# Patient Record
Sex: Female | Born: 1980 | Race: Black or African American | Hispanic: No | Marital: Single | Smoking: Former smoker
Health system: Southern US, Community
[De-identification: ages and names within clinical notes are randomized; demographics above are authoritative.]

## PROBLEM LIST (undated history)

## (undated) DIAGNOSIS — E669 Obesity, unspecified: Secondary | ICD-10-CM

## (undated) DIAGNOSIS — R519 Headache, unspecified: Secondary | ICD-10-CM

## (undated) DIAGNOSIS — D259 Leiomyoma of uterus, unspecified: Secondary | ICD-10-CM

## (undated) DIAGNOSIS — R51 Headache: Secondary | ICD-10-CM

## (undated) DIAGNOSIS — R87629 Unspecified abnormal cytological findings in specimens from vagina: Secondary | ICD-10-CM

## (undated) DIAGNOSIS — Z8759 Personal history of other complications of pregnancy, childbirth and the puerperium: Secondary | ICD-10-CM

## (undated) HISTORY — DX: Obesity, unspecified: E66.9

## (undated) HISTORY — DX: Personal history of other complications of pregnancy, childbirth and the puerperium: Z87.59

## (undated) HISTORY — PX: GALLBLADDER SURGERY: SHX652

## (undated) HISTORY — DX: Headache: R51

## (undated) HISTORY — DX: Unspecified abnormal cytological findings in specimens from vagina: R87.629

## (undated) HISTORY — DX: Leiomyoma of uterus, unspecified: D25.9

## (undated) HISTORY — PX: HERNIA REPAIR: SHX51

## (undated) HISTORY — DX: Headache, unspecified: R51.9

---

## 2004-01-07 ENCOUNTER — Other Ambulatory Visit: Admission: RE | Admit: 2004-01-07 | Discharge: 2004-01-07 | Payer: Self-pay | Admitting: *Deleted

## 2008-07-06 ENCOUNTER — Ambulatory Visit: Payer: Self-pay | Admitting: Family Medicine

## 2008-12-03 ENCOUNTER — Emergency Department: Payer: Self-pay | Admitting: Emergency Medicine

## 2008-12-08 ENCOUNTER — Ambulatory Visit: Payer: Self-pay | Admitting: Surgery

## 2008-12-14 ENCOUNTER — Inpatient Hospital Stay: Payer: Self-pay | Admitting: Surgery

## 2008-12-24 ENCOUNTER — Ambulatory Visit (HOSPITAL_COMMUNITY): Admission: RE | Admit: 2008-12-24 | Discharge: 2008-12-24 | Payer: Self-pay | Admitting: Gastroenterology

## 2009-10-09 IMAGING — RF DG ERCP WO/W SPHINCTEROTOMY
4 series · 9 of 9 positions shown · non-contrast
Comparison: None

CLINICAL DATA: Choledocholithiasis.

ERCP with sphincterotomy
Fluoroscopy time:  3.8 minutes.
TECHNIQUE: Multiple spot images obtained with the fluoroscopic
device and submitted for interpretation post-procedure.  ERCP
performed by Dr. Nomasibulele without a radiologist in attendance; please
see their report for details.

[Series 1: cont. · 4 of 4 slices shown (1 of 4)]
[im 1/4]
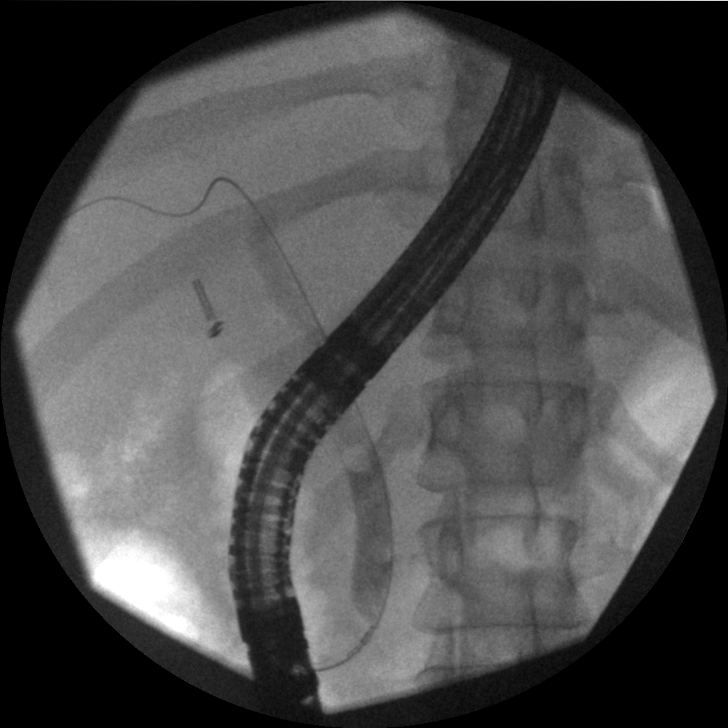
[im 2/4]
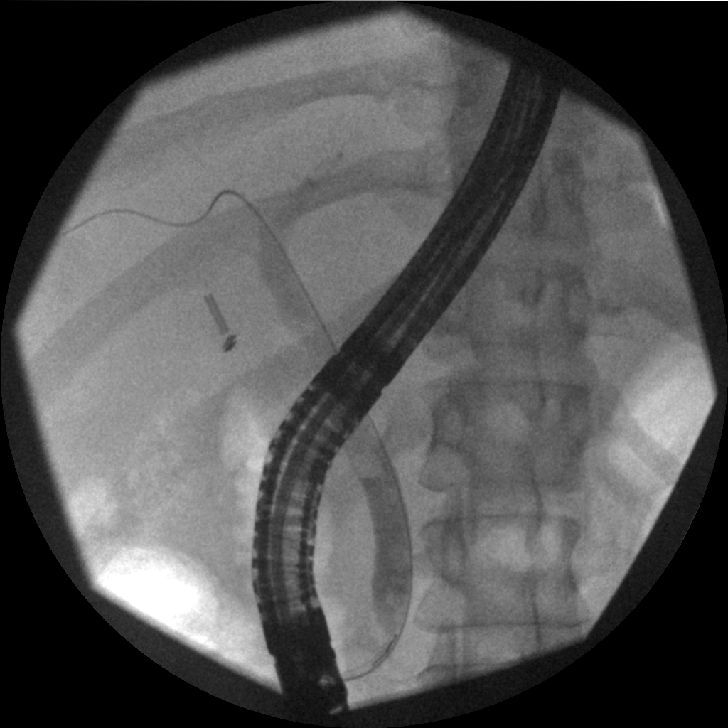
[im 3/4]
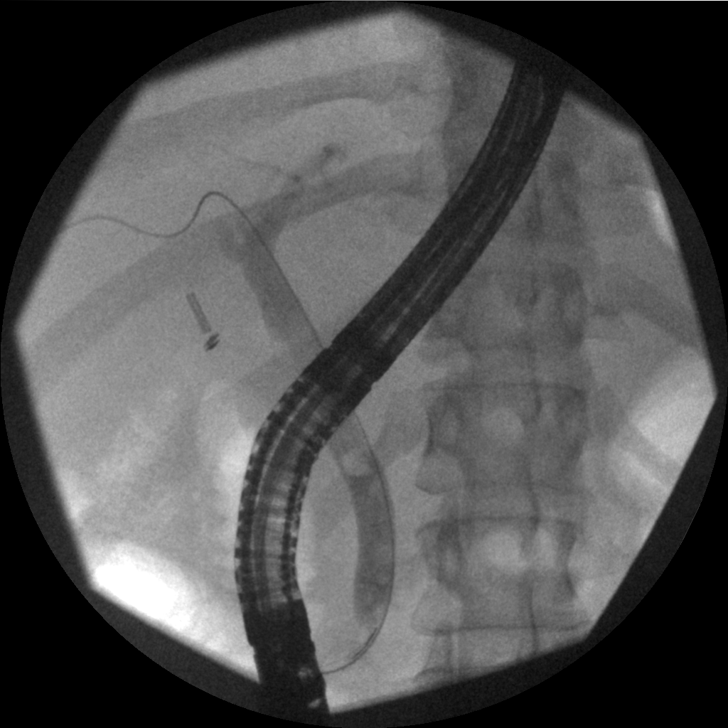
[im 4/4]
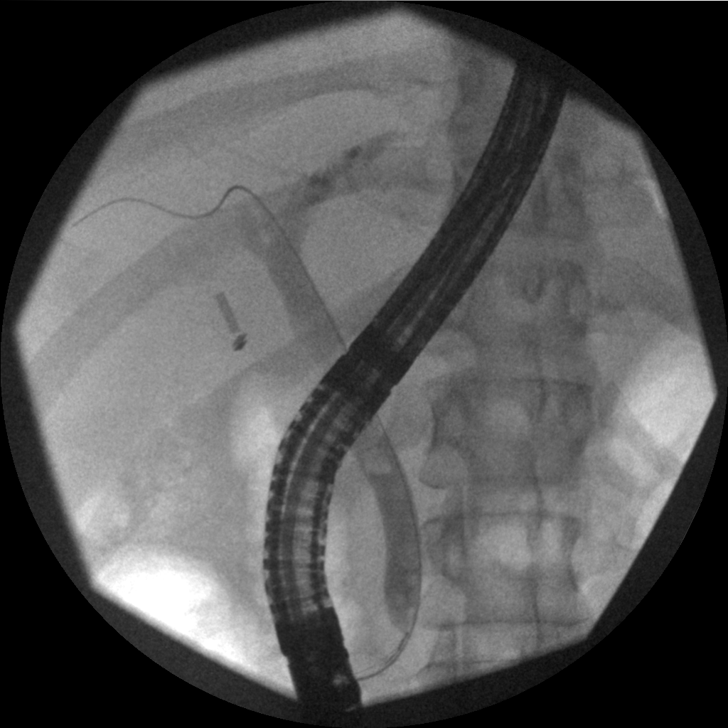

[Series 3: cont. · 3 of 3 slices shown (2 of 4)]
[im 1/3]
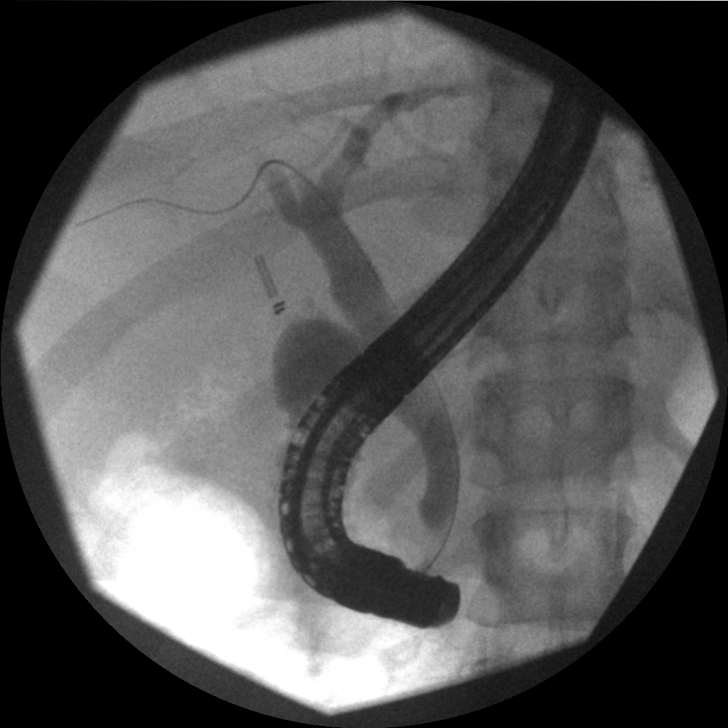
[im 2/3]
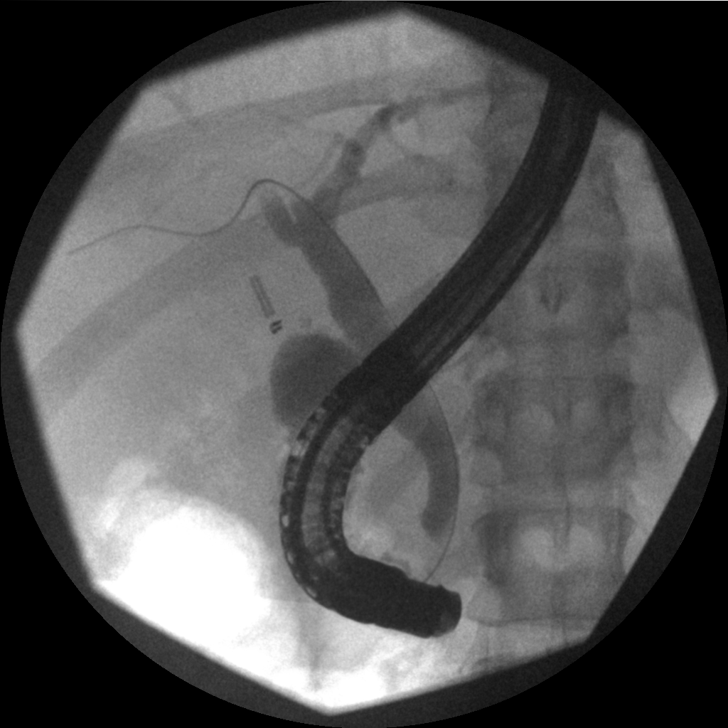
[im 3/3]
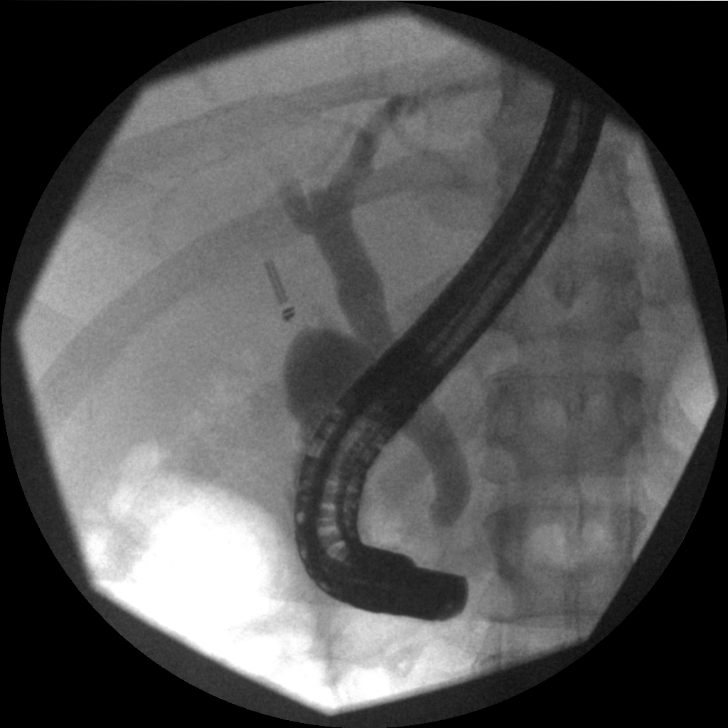

[Series 4: cont. · 1 of 1 slices shown (3 of 4)]
[im 1/1]
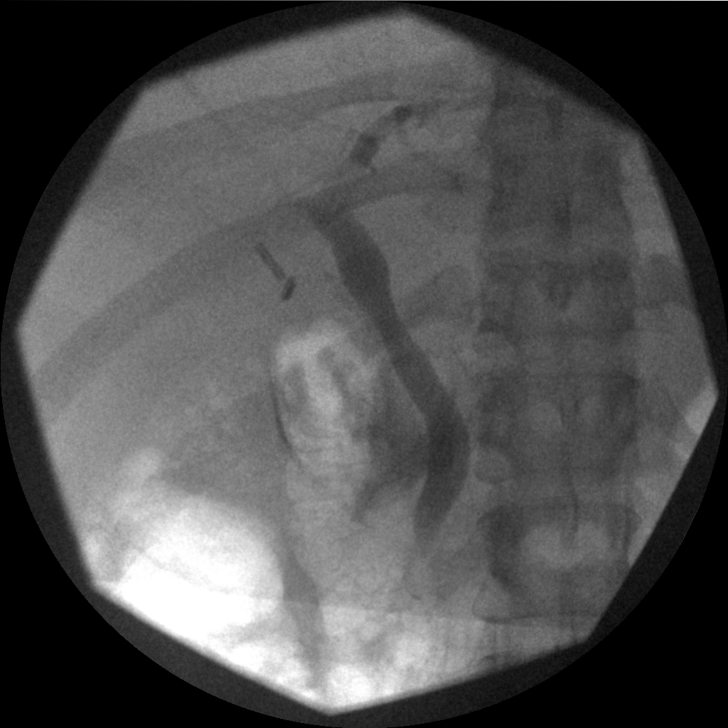

[Series 5: cont. · 1 of 1 slices shown (4 of 4)]
[im 1/1]
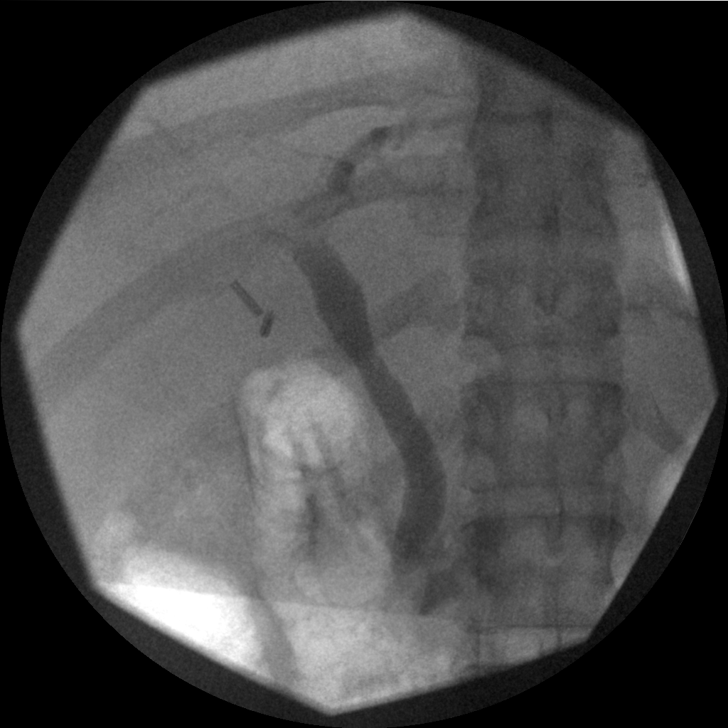

[9 of 9 positions shown; findings below may reference images not displayed]

FINDINGS: The initial submitted images demonstrate several small
filling defects in the common bile duct compatible with
choledocholithiasis.  Cholecystectomy clips are noted.  By report,
sphincterotomy and stone extraction were performed.  Final images
demonstrate no residual calculi within the common bile duct or
extravasation of contrast.  Good drainage into the duodenum is not
documented.
IMPRESSION: Choledocholithiasis with sphincterotomy and stone extraction as
described.

## 2014-08-27 NOTE — L&D Delivery Note (Signed)
Delivery Summary for [employer]  Labor Events:   Preterm labor:   Rupture date:   Rupture time:   Rupture type:   Fluid Color:   Induction:   Augmentation:   Complications:   Cervical ripening:          Delivery:   Episiotomy:   Lacerations:   Repair suture:   Repair # of packets:   Blood loss (ml): 300   Information for the patient's newborn:  Takyah, Ciaramitaro [[id number]]    Delivery 05/10/2015 2:50 AM by  Vaginal, Spontaneous Delivery Sex:  female Gestational Age: [redacted]w[redacted]d Delivery Clinician:  [place]?: Yes        APGARS  One minute Five minutes Ten minutes  Skin color: 0   1      Heart rate: 2   2      Grimace: 0   2      Muscle tone: 0   1      Breathing: 1   2      Totals: 3  8      Presentation/position: Vertex     Resuscitation:   Cord information: 3 vessels   Disposition of cord blood: No    Blood gases sent? Yes Complications: None  Placenta: Delivered: 05/10/2015 3:30 AM  Manual removal  Intact appearance Newborn Measurements: Weight: 6 lb 7 oz (2920 g)  Height: 20.87"  Head circumference: 29 cm  Chest circumference: 29 cm  Other providers: Registered Nurse Registered Nurse Darleen Crocker  Additional  information: Forceps:   Vacuum:   Breech:   Observed anomalies         Delivery Note At 2:50 AM a viable female was delivered via Vaginal, Spontaneous Delivery (Presentation:cephalic; LOA position ).  APGAR: 3,8 ; weight 2920 grams.   Placenta status: manually removed, intact.  Cord: 3 vessels with the following complications: None.  Cord pH: pending (venous)  Anesthesia: Epidural  Episiotomy: None Lacerations: 1st degree Suture Repair: 2.0  Mom to postpartum. To remain on Magnesium Sulfate for an additional 12-24 hours. Baby to Couplet care / Skin to Skin.  Rubie Maid 05/10/2015, 3:50 AM

## 2014-10-08 LAB — OB RESULTS CONSOLE PLATELET COUNT: Platelets: 350 10*3/uL

## 2014-10-08 LAB — OB RESULTS CONSOLE ANTIBODY SCREEN: Antibody Screen: NEGATIVE

## 2014-10-08 LAB — OB RESULTS CONSOLE ABO/RH: RH Type: POSITIVE

## 2014-10-08 LAB — OB RESULTS CONSOLE HIV ANTIBODY (ROUTINE TESTING): HIV: NONREACTIVE

## 2014-10-08 LAB — OB RESULTS CONSOLE VARICELLA ZOSTER ANTIBODY, IGG: Varicella: IMMUNE

## 2014-10-08 LAB — OB RESULTS CONSOLE HGB/HCT, BLOOD
HCT: 40 %
Hemoglobin: 13.7 g/dL

## 2014-10-08 LAB — OB RESULTS CONSOLE RUBELLA ANTIBODY, IGM: Rubella: IMMUNE

## 2014-10-08 LAB — OB RESULTS CONSOLE GC/CHLAMYDIA
Chlamydia: NEGATIVE
Gonorrhea: NEGATIVE

## 2014-10-08 LAB — OB RESULTS CONSOLE RPR: RPR: NONREACTIVE

## 2014-10-08 LAB — OB RESULTS CONSOLE HEPATITIS B SURFACE ANTIGEN: Hepatitis B Surface Ag: NEGATIVE

## 2015-01-27 ENCOUNTER — Ambulatory Visit (INDEPENDENT_AMBULATORY_CARE_PROVIDER_SITE_OTHER): Payer: BLUE CROSS/BLUE SHIELD | Admitting: Obstetrics and Gynecology

## 2015-01-27 ENCOUNTER — Encounter: Payer: Self-pay | Admitting: Obstetrics and Gynecology

## 2015-01-27 VITALS — BP 108/71 | HR 88 | Ht 60.0 in | Wt 204.5 lb

## 2015-01-27 DIAGNOSIS — O3412 Maternal care for benign tumor of corpus uteri, second trimester: Secondary | ICD-10-CM

## 2015-01-27 DIAGNOSIS — O99210 Obesity complicating pregnancy, unspecified trimester: Secondary | ICD-10-CM

## 2015-01-27 DIAGNOSIS — D259 Leiomyoma of uterus, unspecified: Secondary | ICD-10-CM

## 2015-01-27 DIAGNOSIS — Z3482 Encounter for supervision of other normal pregnancy, second trimester: Secondary | ICD-10-CM

## 2015-01-27 DIAGNOSIS — Z3492 Encounter for supervision of normal pregnancy, unspecified, second trimester: Secondary | ICD-10-CM

## 2015-01-27 DIAGNOSIS — Z3493 Encounter for supervision of normal pregnancy, unspecified, third trimester: Secondary | ICD-10-CM | POA: Insufficient documentation

## 2015-01-27 LAB — POCT URINALYSIS DIPSTICK
Bilirubin, UA: NEGATIVE
Glucose, UA: NEGATIVE
Ketones, UA: NEGATIVE
Leukocytes, UA: NEGATIVE
Nitrite, UA: NEGATIVE
Protein, UA: NEGATIVE
Spec Grav, UA: 1.01
Urobilinogen, UA: 0.2
pH, UA: 7

## 2015-01-27 NOTE — Patient Instructions (Signed)
Follow up in 1 week for ultrasound.  Follow up in 4 weeks for routine OB visit.  Will perform 1 hour glucola at that time.

## 2015-01-27 NOTE — Progress Notes (Signed)
ROB: Patient denies complaints. Doing well.  Now noting fetal movement.  To schedule f/u ultrasound next week for f/u of large fibroids in pregnancy and fetal growth. RTC in 4 weeks. For glucola and Tdap at that time .

## 2015-02-08 ENCOUNTER — Other Ambulatory Visit: Payer: BLUE CROSS/BLUE SHIELD

## 2015-02-11 ENCOUNTER — Ambulatory Visit: Payer: BLUE CROSS/BLUE SHIELD

## 2015-02-11 ENCOUNTER — Other Ambulatory Visit: Payer: BLUE CROSS/BLUE SHIELD

## 2015-02-11 DIAGNOSIS — D259 Leiomyoma of uterus, unspecified: Secondary | ICD-10-CM

## 2015-02-11 DIAGNOSIS — O3412 Maternal care for benign tumor of corpus uteri, second trimester: Principal | ICD-10-CM

## 2015-02-24 ENCOUNTER — Ambulatory Visit (INDEPENDENT_AMBULATORY_CARE_PROVIDER_SITE_OTHER): Payer: BLUE CROSS/BLUE SHIELD | Admitting: Obstetrics and Gynecology

## 2015-02-24 VITALS — BP 109/71 | HR 80 | Wt 207.4 lb

## 2015-02-24 DIAGNOSIS — Z131 Encounter for screening for diabetes mellitus: Secondary | ICD-10-CM

## 2015-02-24 DIAGNOSIS — Z3483 Encounter for supervision of other normal pregnancy, third trimester: Secondary | ICD-10-CM

## 2015-02-24 DIAGNOSIS — Z3493 Encounter for supervision of normal pregnancy, unspecified, third trimester: Secondary | ICD-10-CM

## 2015-02-24 LAB — POCT URINALYSIS DIPSTICK
Bilirubin, UA: NEGATIVE
Glucose, UA: NEGATIVE
Ketones, UA: NEGATIVE
Leukocytes, UA: NEGATIVE
Nitrite, UA: NEGATIVE
Protein, UA: NEGATIVE
Spec Grav, UA: <=1.005
Urobilinogen, UA: 0.2
pH, UA: 7.5

## 2015-02-25 LAB — GLUCOSE TOLERANCE, 1 HOUR: Glucose, 1Hr PP: 140 mg/dL (ref 65–199)

## 2015-02-25 LAB — HEMOGLOBIN AND HEMATOCRIT, BLOOD
Hematocrit: 35.3 % (ref 34.0–46.6)
Hemoglobin: 11.9 g/dL (ref 11.1–15.9)

## 2015-02-26 NOTE — Progress Notes (Signed)
ROB: Denies complaints. Does report mild fatigue. Had f/u sono last week to reassess fibroids in pregnancy.  Fibroid growth has increased to 9 cm, near internal cervical os.  Will continue to f/u growth scans q 4-6 weeks.  Discussed that if fibroid continues to remain at LUS and depending on location (intramural vs serosal), may requirerequire C-section as mode of delivery.  Patient notes understanding. Growth normal (47%) and AFI normal (13.2 cm).  Completed glucola today. Desires to breastfeed.  Is considering Nexplanon for contraception.

## 2015-03-10 ENCOUNTER — Ambulatory Visit (INDEPENDENT_AMBULATORY_CARE_PROVIDER_SITE_OTHER): Payer: BLUE CROSS/BLUE SHIELD | Admitting: Obstetrics and Gynecology

## 2015-03-10 VITALS — BP 111/72 | HR 85 | Wt 209.2 lb

## 2015-03-10 DIAGNOSIS — Z23 Encounter for immunization: Secondary | ICD-10-CM | POA: Diagnosis not present

## 2015-03-10 DIAGNOSIS — O3413 Maternal care for benign tumor of corpus uteri, third trimester: Secondary | ICD-10-CM

## 2015-03-10 DIAGNOSIS — O99210 Obesity complicating pregnancy, unspecified trimester: Secondary | ICD-10-CM

## 2015-03-10 DIAGNOSIS — D259 Leiomyoma of uterus, unspecified: Secondary | ICD-10-CM

## 2015-03-10 DIAGNOSIS — Z3403 Encounter for supervision of normal first pregnancy, third trimester: Secondary | ICD-10-CM

## 2015-03-10 LAB — POCT URINALYSIS DIPSTICK
Bilirubin, UA: NEGATIVE
Glucose, UA: NEGATIVE
Ketones, UA: NEGATIVE
Leukocytes, UA: NEGATIVE
Nitrite, UA: NEGATIVE
Protein, UA: NEGATIVE
Spec Grav, UA: 1.01
Urobilinogen, UA: NEGATIVE
pH, UA: 7

## 2015-03-11 NOTE — Progress Notes (Signed)
ROB: No other complaints or concerns.  Routine obstetric precautions reviewed.  Is due for growth scan again at next visit for h/o large fibroids in pregnancy. Tdap given today, blood consent signed.  Glucola wnl. RTC in 2 weeks.

## 2015-03-23 ENCOUNTER — Ambulatory Visit: Payer: BLUE CROSS/BLUE SHIELD

## 2015-03-23 ENCOUNTER — Other Ambulatory Visit: Payer: BLUE CROSS/BLUE SHIELD

## 2015-03-23 ENCOUNTER — Encounter: Payer: Self-pay | Admitting: Obstetrics and Gynecology

## 2015-03-23 ENCOUNTER — Ambulatory Visit (INDEPENDENT_AMBULATORY_CARE_PROVIDER_SITE_OTHER): Payer: BLUE CROSS/BLUE SHIELD | Admitting: Obstetrics and Gynecology

## 2015-03-23 ENCOUNTER — Encounter: Payer: BLUE CROSS/BLUE SHIELD | Admitting: Obstetrics and Gynecology

## 2015-03-23 VITALS — BP 120/78 | HR 87 | Wt 212.1 lb

## 2015-03-23 DIAGNOSIS — O3413 Maternal care for benign tumor of corpus uteri, third trimester: Principal | ICD-10-CM

## 2015-03-23 DIAGNOSIS — D259 Leiomyoma of uterus, unspecified: Secondary | ICD-10-CM

## 2015-03-23 DIAGNOSIS — R399 Unspecified symptoms and signs involving the genitourinary system: Secondary | ICD-10-CM

## 2015-03-23 DIAGNOSIS — Z3483 Encounter for supervision of other normal pregnancy, third trimester: Secondary | ICD-10-CM | POA: Diagnosis not present

## 2015-03-23 LAB — POCT URINALYSIS DIPSTICK
Bilirubin, UA: NEGATIVE
Glucose, UA: NEGATIVE
Ketones, UA: NEGATIVE
Leukocytes, UA: NEGATIVE
Nitrite, UA: NEGATIVE
Spec Grav, UA: 1.015
Urobilinogen, UA: NEGATIVE
pH, UA: 6.5

## 2015-03-24 NOTE — Progress Notes (Signed)
ROB: Patient reports complaints of urinary frequency.  Unsure if it is due to the pregnancy or UTI symptoms.   Is s/p f/u growth scan for h/o large fibroids in pregnancy.  Gorwth scan normal, 33%, normal AFI.  Fibroid still 9-10 cm (unchanged from prior scan) however is moving right lateral to LUS. Will f/u in 4 weeks with repeat scan.  Will determine delivery plan at that time. RTC in 2 weeks.

## 2015-03-26 LAB — URINE CULTURE

## 2015-04-07 ENCOUNTER — Encounter: Payer: Self-pay | Admitting: Obstetrics and Gynecology

## 2015-04-07 ENCOUNTER — Ambulatory Visit (INDEPENDENT_AMBULATORY_CARE_PROVIDER_SITE_OTHER): Payer: BLUE CROSS/BLUE SHIELD | Admitting: Obstetrics and Gynecology

## 2015-04-07 VITALS — BP 109/71 | HR 96 | Wt 213.0 lb

## 2015-04-07 DIAGNOSIS — Z3493 Encounter for supervision of normal pregnancy, unspecified, third trimester: Secondary | ICD-10-CM

## 2015-04-07 DIAGNOSIS — O3413 Maternal care for benign tumor of corpus uteri, third trimester: Secondary | ICD-10-CM

## 2015-04-07 DIAGNOSIS — D259 Leiomyoma of uterus, unspecified: Secondary | ICD-10-CM

## 2015-04-07 LAB — POCT URINALYSIS DIPSTICK
Bilirubin, UA: NEGATIVE
Glucose, UA: NEGATIVE
Ketones, UA: NEGATIVE
Leukocytes, UA: NEGATIVE
Nitrite, UA: NEGATIVE
Spec Grav, UA: 1.01
Urobilinogen, UA: 0.2
pH, UA: 7

## 2015-04-07 NOTE — Progress Notes (Signed)
NO complaints.

## 2015-04-07 NOTE — Progress Notes (Signed)
ROB: Patient denies major complaints. PTL and fetal kick counts discussed.  For final growth scan in 2 weeks.  RTC in 2 weeks.  For 36 week labs at that time.

## 2015-04-20 ENCOUNTER — Ambulatory Visit (INDEPENDENT_AMBULATORY_CARE_PROVIDER_SITE_OTHER): Payer: BLUE CROSS/BLUE SHIELD | Admitting: Obstetrics and Gynecology

## 2015-04-20 ENCOUNTER — Ambulatory Visit: Payer: BLUE CROSS/BLUE SHIELD

## 2015-04-20 VITALS — BP 126/88 | HR 82 | Wt 218.8 lb

## 2015-04-20 DIAGNOSIS — O3413 Maternal care for benign tumor of corpus uteri, third trimester: Principal | ICD-10-CM

## 2015-04-20 DIAGNOSIS — Z3483 Encounter for supervision of other normal pregnancy, third trimester: Secondary | ICD-10-CM

## 2015-04-20 DIAGNOSIS — D259 Leiomyoma of uterus, unspecified: Secondary | ICD-10-CM

## 2015-04-20 DIAGNOSIS — Z331 Pregnant state, incidental: Secondary | ICD-10-CM

## 2015-04-20 DIAGNOSIS — Z3490 Encounter for supervision of normal pregnancy, unspecified, unspecified trimester: Secondary | ICD-10-CM

## 2015-04-20 DIAGNOSIS — O3412 Maternal care for benign tumor of corpus uteri, second trimester: Secondary | ICD-10-CM

## 2015-04-20 DIAGNOSIS — O99210 Obesity complicating pregnancy, unspecified trimester: Secondary | ICD-10-CM

## 2015-04-20 DIAGNOSIS — Z3493 Encounter for supervision of normal pregnancy, unspecified, third trimester: Secondary | ICD-10-CM

## 2015-04-20 LAB — POCT URINALYSIS DIPSTICK
Bilirubin, UA: NEGATIVE
Blood, UA: NEGATIVE
Glucose, UA: NEGATIVE
Ketones, UA: NEGATIVE
Leukocytes, UA: NEGATIVE
Nitrite, UA: NEGATIVE
Spec Grav, UA: 1.01
Urobilinogen, UA: NEGATIVE
pH, UA: 5

## 2015-04-20 NOTE — Progress Notes (Signed)
ROB: Doing well. Denies complaints.  Growth scan with normal growth, stable fibroid size.  Fibroid has moved lateral to LUS, patient able to have trial of labor. For 36 week labs today.  PTL precautions. RTC in 1 week.

## 2015-04-21 LAB — OB RESULTS CONSOLE GBS: GBS: POSITIVE

## 2015-04-21 LAB — OB RESULTS CONSOLE GC/CHLAMYDIA
Chlamydia: NEGATIVE
Gonorrhea: NEGATIVE

## 2015-04-21 NOTE — Addendum Note (Signed)
Addended by: Augusto Gamble on: 04/21/2015 03:50 PM   Modules accepted: Orders

## 2015-04-23 LAB — GC/CHLAMYDIA PROBE AMP
Chlamydia trachomatis, NAA: NEGATIVE
Neisseria gonorrhoeae by PCR: NEGATIVE

## 2015-04-25 LAB — CULTURE, BETA STREP (GROUP B ONLY): Strep Gp B Culture: POSITIVE — AB

## 2015-04-26 ENCOUNTER — Ambulatory Visit (INDEPENDENT_AMBULATORY_CARE_PROVIDER_SITE_OTHER): Payer: BLUE CROSS/BLUE SHIELD | Admitting: Obstetrics and Gynecology

## 2015-04-26 ENCOUNTER — Encounter: Payer: Self-pay | Admitting: Obstetrics and Gynecology

## 2015-04-26 VITALS — BP 117/74 | HR 93 | Wt 220.1 lb

## 2015-04-26 DIAGNOSIS — Z3493 Encounter for supervision of normal pregnancy, unspecified, third trimester: Secondary | ICD-10-CM

## 2015-04-26 LAB — POCT URINALYSIS DIPSTICK
Bilirubin, UA: NEGATIVE
Blood, UA: NEGATIVE
Glucose, UA: 1
Ketones, UA: NEGATIVE
Leukocytes, UA: NEGATIVE
Nitrite, UA: NEGATIVE
Spec Grav, UA: <=1.005
Urobilinogen, UA: 0.2
pH, UA: 6.5

## 2015-04-26 NOTE — Progress Notes (Signed)
No complaints. Fetus active; No contractions. Cervix is long/closed/posterior.

## 2015-04-28 DIAGNOSIS — Z8759 Personal history of other complications of pregnancy, childbirth and the puerperium: Secondary | ICD-10-CM

## 2015-04-28 HISTORY — DX: Personal history of other complications of pregnancy, childbirth and the puerperium: Z87.59

## 2015-05-04 ENCOUNTER — Encounter: Payer: BLUE CROSS/BLUE SHIELD | Admitting: Obstetrics and Gynecology

## 2015-05-04 ENCOUNTER — Telehealth: Payer: Self-pay | Admitting: Obstetrics and Gynecology

## 2015-05-04 ENCOUNTER — Ambulatory Visit (INDEPENDENT_AMBULATORY_CARE_PROVIDER_SITE_OTHER): Payer: BLUE CROSS/BLUE SHIELD | Admitting: Obstetrics and Gynecology

## 2015-05-04 VITALS — BP 120/81 | HR 92 | Wt 225.1 lb

## 2015-05-04 DIAGNOSIS — R319 Hematuria, unspecified: Secondary | ICD-10-CM

## 2015-05-04 DIAGNOSIS — O3410 Maternal care for benign tumor of corpus uteri, unspecified trimester: Secondary | ICD-10-CM

## 2015-05-04 DIAGNOSIS — Z1389 Encounter for screening for other disorder: Secondary | ICD-10-CM

## 2015-05-04 DIAGNOSIS — M7989 Other specified soft tissue disorders: Secondary | ICD-10-CM

## 2015-05-04 DIAGNOSIS — Z3493 Encounter for supervision of normal pregnancy, unspecified, third trimester: Secondary | ICD-10-CM

## 2015-05-04 DIAGNOSIS — R51 Headache: Secondary | ICD-10-CM

## 2015-05-04 DIAGNOSIS — D259 Leiomyoma of uterus, unspecified: Secondary | ICD-10-CM

## 2015-05-04 DIAGNOSIS — Z3483 Encounter for supervision of other normal pregnancy, third trimester: Secondary | ICD-10-CM

## 2015-05-04 DIAGNOSIS — O26893 Other specified pregnancy related conditions, third trimester: Secondary | ICD-10-CM

## 2015-05-04 DIAGNOSIS — Z369 Encounter for antenatal screening, unspecified: Secondary | ICD-10-CM

## 2015-05-04 DIAGNOSIS — Z36 Encounter for antenatal screening of mother: Secondary | ICD-10-CM

## 2015-05-04 LAB — POCT URINALYSIS DIPSTICK
Bilirubin, UA: NEGATIVE
Glucose, UA: NEGATIVE
Ketones, UA: NEGATIVE
Leukocytes, UA: NEGATIVE
Nitrite, UA: NEGATIVE
Spec Grav, UA: 1.02
Urobilinogen, UA: NEGATIVE
pH, UA: 6.5

## 2015-05-04 NOTE — Patient Instructions (Signed)

## 2015-05-04 NOTE — Progress Notes (Signed)
Pt having headaches, taking medication last 2 days. Pain in right hand.

## 2015-05-04 NOTE — Progress Notes (Signed)
ROB: Patient c/o headache x 3 days, takes Fioricet once daily with modest relief.  Notes bilateral leg swelling. Denies RUQ pain/visual changes. BPs wnl. +3 proteinuria and 5 lb weight gain noted today.  Will order stat PIH labs. Discussion had on pre-eclampsia and treatment (delivery) if diagnosed.  Hematuria present, will also order UCx. Patient is GBS+, will need GBS prophylaxis at time of delivery. F/u in 1 week if undelivered. Declines flu vaccine.

## 2015-05-04 NOTE — Telephone Encounter (Signed)
Contacted patient regarding [place] labs.  Platelets and liver enzymes wnl, however uric acid 6.3, and urine P/C ratio 0.35. Likely in process of developing at least mild pre-eclampsia based on symptoms and labs.  Will have patient f/u in 2 days for repeat BP, as BP normal in clinic today.

## 2015-05-05 ENCOUNTER — Encounter: Payer: BLUE CROSS/BLUE SHIELD | Admitting: Obstetrics and Gynecology

## 2015-05-05 ENCOUNTER — Telehealth: Payer: Self-pay

## 2015-05-05 LAB — PROTEIN / CREATININE RATIO, URINE
Creatinine, Urine: 352.4 mg/dL
Protein, Ur: 185.4 mg/dL
Protein/Creat Ratio: 526 mg/g creat — ABNORMAL HIGH (ref 0–200)

## 2015-05-05 LAB — CBC
Hematocrit: 33.7 % — ABNORMAL LOW (ref 34.0–46.6)
Hemoglobin: 12 g/dL (ref 11.1–15.9)
MCH: 32.3 pg (ref 26.6–33.0)
MCHC: 35.6 g/dL (ref 31.5–35.7)
MCV: 91 fL (ref 79–97)
Platelets: 205 10*3/uL (ref 150–379)
RBC: 3.71 x10E6/uL — ABNORMAL LOW (ref 3.77–5.28)
RDW: 14.8 % (ref 12.3–15.4)
WBC: 5 10*3/uL (ref 3.4–10.8)

## 2015-05-05 LAB — COMPREHENSIVE METABOLIC PANEL
ALT: 20 IU/L (ref 0–32)
AST: 18 IU/L (ref 0–40)
Albumin/Globulin Ratio: 1.5 (ref 1.1–2.5)
Albumin: 3.4 g/dL — ABNORMAL LOW (ref 3.5–5.5)
Alkaline Phosphatase: 138 IU/L — ABNORMAL HIGH (ref 39–117)
BUN/Creatinine Ratio: 7 — ABNORMAL LOW (ref 8–20)
BUN: 5 mg/dL — ABNORMAL LOW (ref 6–20)
Bilirubin Total: 0.3 mg/dL (ref 0.0–1.2)
CO2: 15 mmol/L — ABNORMAL LOW (ref 18–29)
Calcium: 8.8 mg/dL (ref 8.7–10.2)
Chloride: 101 mmol/L (ref 97–108)
Creatinine, Ser: 0.74 mg/dL (ref 0.57–1.00)
GFR calc Af Amer: 124 mL/min/{1.73_m2} (ref >59)
GFR calc non Af Amer: 108 mL/min/{1.73_m2} (ref >59)
Globulin, Total: 2.3 g/dL (ref 1.5–4.5)
Glucose: 159 mg/dL — ABNORMAL HIGH (ref 65–99)
Potassium: 3.7 mmol/L (ref 3.5–5.2)
Sodium: 134 mmol/L (ref 134–144)
Total Protein: 5.7 g/dL — ABNORMAL LOW (ref 6.0–8.5)

## 2015-05-05 LAB — URIC ACID: Uric Acid: 6.3 mg/dL (ref 2.5–7.1)

## 2015-05-05 NOTE — Telephone Encounter (Signed)
I spoke with pt regarding how she was doing and she is at work, no c/o headache today. Pt to come in tomorrow and B/P checked and will let Dr. Marcelline Mates know results.

## 2015-05-05 NOTE — Telephone Encounter (Signed)
Spoke with pt informed her that per Dr.Cherry she is to come in tomorrow for a BP and urine check to evaluate for pre-ecclampsia. Pt states that she was under the impression that she was being induced on Tuesday. Advised pt that after speaking with Dr.Cherry she would only be induced if labs were indicative of that. Pt gave verbal understanding.

## 2015-05-06 ENCOUNTER — Ambulatory Visit (INDEPENDENT_AMBULATORY_CARE_PROVIDER_SITE_OTHER): Payer: BLUE CROSS/BLUE SHIELD | Admitting: Obstetrics and Gynecology

## 2015-05-06 VITALS — BP 118/77 | HR 82 | Ht 60.0 in | Wt 227.5 lb

## 2015-05-06 DIAGNOSIS — D259 Leiomyoma of uterus, unspecified: Secondary | ICD-10-CM

## 2015-05-06 DIAGNOSIS — Z36 Encounter for antenatal screening of mother: Secondary | ICD-10-CM

## 2015-05-06 DIAGNOSIS — O3413 Maternal care for benign tumor of corpus uteri, third trimester: Secondary | ICD-10-CM

## 2015-05-06 DIAGNOSIS — O1403 Mild to moderate pre-eclampsia, third trimester: Secondary | ICD-10-CM

## 2015-05-06 DIAGNOSIS — Z331 Pregnant state, incidental: Secondary | ICD-10-CM

## 2015-05-06 DIAGNOSIS — M7989 Other specified soft tissue disorders: Secondary | ICD-10-CM

## 2015-05-06 DIAGNOSIS — Z369 Encounter for antenatal screening, unspecified: Secondary | ICD-10-CM

## 2015-05-06 DIAGNOSIS — Z1389 Encounter for screening for other disorder: Secondary | ICD-10-CM

## 2015-05-06 LAB — POCT URINALYSIS DIPSTICK
Bilirubin, UA: NEGATIVE
Glucose, UA: NEGATIVE
Ketones, UA: NEGATIVE
Leukocytes, UA: NEGATIVE
Nitrite, UA: NEGATIVE
Spec Grav, UA: 1.025
Urobilinogen, UA: NEGATIVE
pH, UA: 6.5

## 2015-05-06 LAB — URINE CULTURE

## 2015-05-06 NOTE — Progress Notes (Signed)
Patient ID: Tonya Wagner, female   DOB: [birth year], 34 y.o.   MRN: [id number]  Pt here for B/P check. Noted swelling both upper and lower extremeties, hands and slightly notable in face. Weight gain of 2.6 lbs in 2 days. B/P 118/77, 3+ protein in urine, and blood.  No c/o blurred vision, dizziness, n/v, but does have headaches although not currently. Dr. Marcelline Mates notified and informed pt per Dr. Marcelline Mates that she will contact her later as to what time she will need to go to hospital on Sunday night for admission. Pt best contact number is cell: [phone] and may leave msg on home phone if unable to reach by cell.  I also encouraged pt to go immediately to ER if any changes noted or if any questions may contact triage nurse after hours. Pt voiced understanding.

## 2015-05-08 ENCOUNTER — Encounter: Payer: Self-pay | Admitting: *Deleted

## 2015-05-08 ENCOUNTER — Inpatient Hospital Stay
Admission: EM | Admit: 2015-05-08 | Discharge: 2015-05-13 | DRG: 774 | Disposition: A | Discharge status: Home / Self Care | Payer: BLUE CROSS/BLUE SHIELD | Attending: Obstetrics and Gynecology | Admitting: Obstetrics and Gynecology

## 2015-05-08 DIAGNOSIS — Z3A38 38 weeks gestation of pregnancy: Secondary | ICD-10-CM | POA: Diagnosis present

## 2015-05-08 DIAGNOSIS — D259 Leiomyoma of uterus, unspecified: Secondary | ICD-10-CM | POA: Diagnosis present

## 2015-05-08 DIAGNOSIS — O99824 Streptococcus B carrier state complicating childbirth: Secondary | ICD-10-CM | POA: Diagnosis present

## 2015-05-08 DIAGNOSIS — O1403 Mild to moderate pre-eclampsia, third trimester: Secondary | ICD-10-CM | POA: Diagnosis present

## 2015-05-08 DIAGNOSIS — O3413 Maternal care for benign tumor of corpus uteri, third trimester: Secondary | ICD-10-CM | POA: Diagnosis present

## 2015-05-08 DIAGNOSIS — Z1389 Encounter for screening for other disorder: Secondary | ICD-10-CM

## 2015-05-08 DIAGNOSIS — Z331 Pregnant state, incidental: Secondary | ICD-10-CM

## 2015-05-08 DIAGNOSIS — Z3483 Encounter for supervision of other normal pregnancy, third trimester: Secondary | ICD-10-CM | POA: Diagnosis not present

## 2015-05-08 DIAGNOSIS — O99210 Obesity complicating pregnancy, unspecified trimester: Secondary | ICD-10-CM | POA: Diagnosis present

## 2015-05-08 DIAGNOSIS — O1413 Severe pre-eclampsia, third trimester: Secondary | ICD-10-CM | POA: Diagnosis present

## 2015-05-08 DIAGNOSIS — Z87891 Personal history of nicotine dependence: Secondary | ICD-10-CM

## 2015-05-08 DIAGNOSIS — Z6841 Body Mass Index (BMI) 40.0 and over, adult: Secondary | ICD-10-CM

## 2015-05-08 HISTORY — DX: Leiomyoma of uterus, unspecified: D25.9

## 2015-05-08 LAB — COMPREHENSIVE METABOLIC PANEL
ALT: 22 U/L (ref 14–54)
AST: 25 U/L (ref 15–41)
Albumin: 3.1 g/dL — ABNORMAL LOW (ref 3.5–5.0)
Alkaline Phosphatase: 138 U/L — ABNORMAL HIGH (ref 38–126)
Anion gap: 9 (ref 5–15)
BUN: <5 mg/dL — ABNORMAL LOW (ref 6–20)
CO2: 20 mmol/L — ABNORMAL LOW (ref 22–32)
Calcium: 8.8 mg/dL — ABNORMAL LOW (ref 8.9–10.3)
Chloride: 107 mmol/L (ref 101–111)
Creatinine, Ser: 0.64 mg/dL (ref 0.44–1.00)
GFR calc Af Amer: >60 mL/min (ref >60)
GFR calc non Af Amer: >60 mL/min (ref >60)
Glucose, Bld: 79 mg/dL (ref 65–99)
Potassium: 3.7 mmol/L (ref 3.5–5.1)
Sodium: 136 mmol/L (ref 135–145)
Total Bilirubin: 0.9 mg/dL (ref 0.3–1.2)
Total Protein: 6.7 g/dL (ref 6.5–8.1)

## 2015-05-08 LAB — CBC
HCT: 34 % — ABNORMAL LOW (ref 35.0–47.0)
HCT: 37.3 % (ref 35.0–47.0)
Hemoglobin: 11.9 g/dL — ABNORMAL LOW (ref 12.0–16.0)
Hemoglobin: 12.8 g/dL (ref 12.0–16.0)
MCH: 32.2 pg (ref 26.0–34.0)
MCH: 31.6 pg (ref 26.0–34.0)
MCHC: 35 g/dL (ref 32.0–36.0)
MCHC: 34.2 g/dL (ref 32.0–36.0)
MCV: 91.8 fL (ref 80.0–100.0)
MCV: 92.3 fL (ref 80.0–100.0)
Platelets: 188 10*3/uL (ref 150–440)
Platelets: 190 10*3/uL (ref 150–440)
RBC: 3.7 MIL/uL — ABNORMAL LOW (ref 3.80–5.20)
RBC: 4.04 MIL/uL (ref 3.80–5.20)
RDW: 14.8 % — ABNORMAL HIGH (ref 11.5–14.5)
RDW: 14.8 % — ABNORMAL HIGH (ref 11.5–14.5)
WBC: 6.9 10*3/uL (ref 3.6–11.0)
WBC: 8.2 10*3/uL (ref 3.6–11.0)

## 2015-05-08 LAB — TYPE AND SCREEN
ABO/RH(D): O POS
Antibody Screen: NEGATIVE

## 2015-05-08 LAB — PROTEIN / CREATININE RATIO, URINE
Creatinine, Urine: 63 mg/dL
Protein Creatinine Ratio: 1.11 mg/mg{Cre} — ABNORMAL HIGH (ref 0.00–0.15)
Total Protein, Urine: 70 mg/dL

## 2015-05-08 LAB — URIC ACID: Uric Acid, Serum: 5.6 mg/dL (ref 2.3–6.6)

## 2015-05-08 LAB — ABO/RH: ABO/RH(D): O POS

## 2015-05-08 LAB — CHLAMYDIA/NGC RT PCR (ARMC ONLY)
Chlamydia Tr: NOT DETECTED
N gonorrhoeae: NOT DETECTED

## 2015-05-08 MED ORDER — SODIUM CHLORIDE 0.9 % IV SOLN
1.0000 g | INTRAVENOUS | Status: DC
  Administered 2015-05-09 – 2015-05-10 (×7): 1 g via INTRAVENOUS
  Filled 2015-05-08 (×7): qty 1000

## 2015-05-08 MED ORDER — LIDOCAINE HCL (PF) 1 % IJ SOLN
INTRAMUSCULAR | Status: AC
  Filled 2015-05-08: qty 30

## 2015-05-08 MED ORDER — CITRIC ACID-SODIUM CITRATE 334-500 MG/5ML PO SOLN: 30.0000 mL | ORAL | Status: DC | PRN

## 2015-05-08 MED ORDER — OXYCODONE-ACETAMINOPHEN 5-325 MG PO TABS
2.0000 | ORAL_TABLET | ORAL | Status: DC | PRN
  Administered 2015-05-10: 2 via ORAL

## 2015-05-08 MED ORDER — LABETALOL HCL 5 MG/ML IV SOLN: 20.0000 mg | INTRAVENOUS | Status: DC | PRN

## 2015-05-08 MED ORDER — MISOPROSTOL 25 MCG QUARTER TABLET
25.0000 ug | ORAL_TABLET | ORAL | Status: DC | PRN
  Administered 2015-05-08 (×2): 25 ug via VAGINAL
  Filled 2015-05-08 (×2): qty 0.25

## 2015-05-08 MED ORDER — MAGNESIUM SULFATE BOLUS VIA INFUSION
4.0000 g | Freq: Once | INTRAVENOUS | Status: AC
  Filled 2015-05-08: qty 500

## 2015-05-08 MED ORDER — ONDANSETRON HCL 4 MG/2ML IJ SOLN: 4.0000 mg | Freq: Four times a day (QID) | INTRAMUSCULAR | Status: DC | PRN

## 2015-05-08 MED ORDER — INFLUENZA VAC SPLIT QUAD 0.5 ML IM SUSY
0.5000 mL | PREFILLED_SYRINGE | INTRAMUSCULAR | Status: DC
  Filled 2015-05-08: qty 0.5

## 2015-05-08 MED ORDER — OXYTOCIN 40 UNITS IN LACTATED RINGERS INFUSION - SIMPLE MED
1.0000 m[IU]/min | INTRAVENOUS | Status: DC
  Administered 2015-05-08: 1 m[IU]/min via INTRAVENOUS
  Administered 2015-05-09: 12 m[IU]/min via INTRAVENOUS
  Administered 2015-05-09: 10 m[IU]/min via INTRAVENOUS

## 2015-05-08 MED ORDER — OXYTOCIN 40 UNITS IN LACTATED RINGERS INFUSION - SIMPLE MED: 62.5000 mL/h | INTRAVENOUS | Status: DC

## 2015-05-08 MED ORDER — LACTATED RINGERS IV SOLN: 500.0000 mL | INTRAVENOUS | Status: DC | PRN

## 2015-05-08 MED ORDER — MISOPROSTOL 200 MCG PO TABS
ORAL_TABLET | ORAL | Status: AC
  Filled 2015-05-08: qty 4

## 2015-05-08 MED ORDER — OXYTOCIN BOLUS FROM INFUSION: 500.0000 mL | INTRAVENOUS | Status: DC

## 2015-05-08 MED ORDER — TERBUTALINE SULFATE 1 MG/ML IJ SOLN: 0.2500 mg | Freq: Once | INTRAMUSCULAR | Status: DC | PRN

## 2015-05-08 MED ORDER — HYDRALAZINE HCL 20 MG/ML IJ SOLN
10.0000 mg | Freq: Once | INTRAMUSCULAR | Status: AC | PRN
  Administered 2015-05-11: 5 mg via INTRAVENOUS

## 2015-05-08 MED ORDER — AMMONIA AROMATIC IN INHA
RESPIRATORY_TRACT | Status: AC
  Filled 2015-05-08: qty 10

## 2015-05-08 MED ORDER — LIDOCAINE HCL (PF) 1 % IJ SOLN: 30.0000 mL | INTRAMUSCULAR | Status: DC | PRN

## 2015-05-08 MED ORDER — LACTATED RINGERS IV SOLN
INTRAVENOUS | Status: DC
  Administered 2015-05-08: 125 mL/h via INTRAVENOUS

## 2015-05-08 MED ORDER — SODIUM CHLORIDE 0.9 % IV SOLN
2.0000 g | Freq: Once | INTRAVENOUS | Status: AC
  Administered 2015-05-08: 2 g via INTRAVENOUS
  Filled 2015-05-08: qty 2000

## 2015-05-08 MED ORDER — BUTORPHANOL TARTRATE 1 MG/ML IJ SOLN: 2.0000 mg | INTRAMUSCULAR | Status: DC | PRN

## 2015-05-08 MED ORDER — OXYTOCIN 10 UNIT/ML IJ SOLN
INTRAMUSCULAR | Status: AC
  Filled 2015-05-08: qty 2

## 2015-05-08 MED ORDER — MAGNESIUM SULFATE 4 GM/100ML IV SOLN
INTRAVENOUS | Status: AC
  Administered 2015-05-08: 4 g
  Filled 2015-05-08: qty 100

## 2015-05-08 MED ORDER — MAGNESIUM SULFATE 50 % IJ SOLN
2.0000 g/h | INTRAVENOUS | Status: DC
  Administered 2015-05-08 – 2015-05-10 (×2): 2 g/h via INTRAVENOUS
  Filled 2015-05-08 (×2): qty 80

## 2015-05-08 MED ORDER — OXYTOCIN 40 UNITS IN LACTATED RINGERS INFUSION - SIMPLE MED
INTRAVENOUS | Status: AC
  Filled 2015-05-08: qty 1000

## 2015-05-08 MED ORDER — SODIUM CHLORIDE 0.9 % IJ SOLN
INTRAMUSCULAR | Status: AC
  Filled 2015-05-08: qty 3

## 2015-05-08 MED ORDER — ACETAMINOPHEN 325 MG PO TABS: 650.0000 mg | ORAL_TABLET | ORAL | Status: DC | PRN

## 2015-05-08 MED ORDER — OXYCODONE-ACETAMINOPHEN 5-325 MG PO TABS
1.0000 | ORAL_TABLET | ORAL | Status: DC | PRN
  Administered 2015-05-09: 1 via ORAL
  Filled 2015-05-08: qty 1

## 2015-05-08 NOTE — Progress Notes (Signed)
   05/08/15 1500  Clinical Encounter Type  Visited With Patient;Family  Visit Type Initial  Referral From Nurse  Spiritual Encounters  Spiritual Needs Other (Comment)  Stress Factors  Patient Stress Factors None identified  Family Stress Factors None identified  Chaplain Marcello Moores and Raquel Sarna checked in on Patient. Offered support and engaged family. Chaplain Marcello Moores and [place]

## 2015-05-08 NOTE — H&P (Addendum)
Obstetric History and Physical  Keundra Rhyder Bratz is a 34 y.o. G2P0010 with IUP at [redacted]w[redacted]d presenting for IOL for mild pre-eclampsia. Patient states she has been having headaches x 3-4 days (partially relieved with Fiorcet and Tylenol), however resolved yesterday. Also reports increased swelling of lower extremities bilaterally. Denies contractions, no vaginal bleeding, intact membranes, with active fetal movement.    Prenatal Course Source of Care: [facility]  with onset of care at 10 weeks Pregnancy complications or risks: Patient Active Problem List   Diagnosis Date Noted  . Fibroid uterus 05/08/2015  . Supervision of normal pregnancy in third trimester 01/27/2015  . Obesity in pregnancy 01/27/2015   She plans to breastfeed She desires Nexplanon for postpartum contraception.   Prenatal labs and studies: ABO, Rh: --/--/O POS (09/11 1051) Antibody: NEG (09/11 1051) Rubella: Immune (02/12 0000) RPR: Nonreactive (02/12 0000)  HBsAg: Negative (02/12 0000)  HIV: Non-reactive (02/12 0000)  PZW:CHENIDPO (08/25 0000) 1 hr Glucola  normal Genetic screening normal Anatomy US normal  Prenatal Transfer Tool  Maternal Diabetes: No Genetic Screening: Normal Maternal Ultrasounds/Referrals: Abnormal:  Findings:   Other: large 9 cm fibroid Fetal Ultrasounds or other Referrals:  None Maternal Substance Abuse:  No Significant Maternal Medications:  None Significant Maternal Lab Results: None  Past Medical History  Diagnosis Date  . Headache   . Vaginal Pap smear, abnormal     Past Surgical History  Procedure Laterality Date  . Hernia repair    . Gallbladder surgery      OB History  Gravida Para Term Preterm AB SAB TAB Ectopic Multiple Living  2    1 1         # Outcome Date GA Lbr Len/2nd Weight Sex Delivery Anes PTL Lv  2 Current           1 SAB         FD      Social History   Social History  . Marital Status: Single    Spouse Name: N/A  . Number of  Children: N/A  . Years of Education: N/A   Social History Main Topics  . Smoking status: Former Research scientist (life sciences)  . Smokeless tobacco: Never Used  . Alcohol Use: No  . Drug Use: No  . Sexual Activity: No   Other Topics Concern  . None   Social History Narrative    Family History  Problem Relation Age of Onset  . Hypertension Mother     Prescriptions prior to admission  Medication Sig Dispense Refill Last Dose  . Butalbital-APAP-Caffeine 50-300-40 MG CAPS   1 Taking  . Prenatal Vit-Fe Fum-FA-Omega (C-NATE DHA) 28-1-200 MG CAPS   4 Taking    Allergies  Allergen Reactions  . Cephalexin Rash  . Sulfamethoxazole-Trimethoprim Nausea And Vomiting    Review of Systems: Negative except for what is mentioned in HPI.  Physical Exam: BP 115/62 mmHg  Pulse 80  Temp(Src) 98.6 F (37 C)  Ht 5' (1.524 m)  Wt 227 lb (102.967 kg)  BMI 44.33 kg/m2  LMP 08/15/2014 CONSTITUTIONAL: Well-developed, well-nourished female in no acute distress.  HENT:  Normocephalic, atraumatic, External right and left ear normal. Oropharynx is clear and moist EYES: Conjunctivae and EOM are normal. Pupils are equal, round, and reactive to light. No scleral icterus.  NECK: Normal range of motion, supple, no masses SKIN: Skin is warm and dry. No rash noted. Not diaphoretic. No erythema. No pallor. [place]: Alert and oriented to person, place, and time. Normal  reflexes, muscle tone coordination. No cranial nerve deficit noted. PSYCHIATRIC: Normal mood and affect. Normal behavior. Normal judgment and thought content. CARDIOVASCULAR: Normal heart rate noted, regular rhythm.  +1 pitting edema in lower extremities bilaterally . RESPIRATORY: Effort and breath sounds normal, no problems with respiration noted ABDOMEN: Soft, nontender, nondistended, gravid. MUSCULOSKELETAL: Normal range of motion. No edema and no tenderness. 2+ distal pulses.  Cervical Exam: Dilatation 0 cm   Effacement 30%   Station ballotable    Presentation: cephalic FHT:  Baseline rate 135 bpm   Variability moderate  Accelerations present   Decelerations none Contractions: q 6-8 min (s/p 1st dose of Cytotec at 9:30 a.m.)   Pertinent Labs/Studies:   Results for orders placed or performed during the hospital encounter of 05/08/15 (from the past 24 hour(s))  Chlamydia/NGC rt PCR ([facility] only)     Status: None   Collection Time: 05/08/15 10:38 AM  Result Value Ref Range   Specimen source GC/Chlam URINE, RANDOM    Chlamydia Tr NOT DETECTED NOT DETECTED   N gonorrhoeae NOT DETECTED NOT DETECTED  CBC     Status: Abnormal   Collection Time: 05/08/15 10:51 AM  Result Value Ref Range   WBC 6.9 3.6 - 11.0 K/uL   RBC 3.70 (L) 3.80 - 5.20 MIL/uL   Hemoglobin 11.9 (L) 12.0 - 16.0 g/dL   HCT 34.0 (L) 35.0 - 47.0 %   MCV 91.8 80.0 - 100.0 fL   MCH 32.2 26.0 - 34.0 pg   MCHC 35.0 32.0 - 36.0 g/dL   RDW 14.8 (H) 11.5 - 14.5 %   Platelets 188 150 - 440 K/uL  Type and screen     Status: None   Collection Time: 05/08/15 10:51 AM  Result Value Ref Range   ABO/RH(D) O POS    Antibody Screen NEG    Sample Expiration 05/11/2015    Lab Results  Component Value Date   CREATININE 0.74 05/04/2015   BUN 5* 05/04/2015   NA 134 05/04/2015   K 3.7 05/04/2015   CL 101 05/04/2015   CO2 15* 05/04/2015    Lab Results  Component Value Date   URICACID 6.3 05/04/2015   Results for Issac, CHERY GIUSTO (MRN [id number]) as of 05/08/2015 13:18  Ref. Range 05/04/2015 16:26  Protein/Creat Ratio Latest Ref Range: 0-200 mg/g creat 526 (H)   Assessment : Bryonna Azara Gemme is a 34 y.o. G2P0010 at [redacted]w[redacted]d being admitted for labor.  Plan: Labor: Induction with Cytotec, progress to Pitocin as needed per protocol. FWB: Reassuring fetal heart tracing.  GBS positive.  For Ampicillin in labor for prophylaxis.  Delivery plan: Hopeful for vaginal delivery   Rubie Maid, MD [facility]

## 2015-05-08 NOTE — Progress Notes (Signed)
Intrapartum Progress Note  S: Patient doing well, denies complaints.   O:  Filed Vitals:   05/08/15 1729 05/08/15 1921 05/08/15 2011 05/08/15 2038  BP: 142/83 159/96 153/91 162/98  Pulse: 79 75 77 75  Temp:  98.6 F (37 C)    TempSrc:  Oral    Resp:  18    Height:      Weight:         Gen App: NAD, comfortable Abdomen: soft, gravid FHT: baseline 145 bpm, accels present, no decels.  Tocometer: contractions q 1-3 min Cervix: 1/40/c/-3/intact Extremities: Nontender, no edema.  Pitocin: 1 mIU  Labs:  Lab Results  Component Value Date   WBC 6.9 05/08/2015   HGB 11.9* 05/08/2015   HCT 34.0* 05/08/2015   MCV 91.8 05/08/2015   PLT 188 05/08/2015    Assessment:  1: SIUP at [redacted]w[redacted]d 2. Mild pre-eclampsia.  Patient with steadily increasing BPs.  Will order PIH labs, Labetalol for BPs, and if continue to remain in severe range, will start Magnesium Sulfate.  3. Foley bulb placed.  4. Low-dose pitocin.   5. Ampicillin for GBS prophylaxis.   Plan:   Rubie Maid, MD 05/08/2015 9:08 PM

## 2015-05-09 ENCOUNTER — Inpatient Hospital Stay: Payer: BLUE CROSS/BLUE SHIELD | Admitting: Registered Nurse

## 2015-05-09 LAB — PLATELET COUNT: Platelets: 222 10*3/uL (ref 150–440)

## 2015-05-09 LAB — RPR: RPR Ser Ql: NONREACTIVE

## 2015-05-09 MED ORDER — BUPIVACAINE HCL (PF) 0.25 % IJ SOLN
INTRAMUSCULAR | Status: DC | PRN
  Administered 2015-05-09: 4 mL via EPIDURAL

## 2015-05-09 MED ORDER — PHENYLEPHRINE 40 MCG/ML (10ML) SYRINGE FOR IV PUSH (FOR BLOOD PRESSURE SUPPORT)
80.0000 ug | PREFILLED_SYRINGE | INTRAVENOUS | Status: DC | PRN
Start: 2015-05-09 — End: 2015-05-10

## 2015-05-09 MED ORDER — FENTANYL 2.5 MCG/ML BUPIVACAINE 1/10 % EPIDURAL INFUSION (WH - ANES): 10.0000 mL/h | INTRAMUSCULAR | Status: DC | PRN

## 2015-05-09 MED ORDER — LACTATED RINGERS IV SOLN: 500.0000 mL | INTRAVENOUS | Status: DC | PRN

## 2015-05-09 MED ORDER — LACTATED RINGERS IV SOLN: INTRAVENOUS | Status: DC

## 2015-05-09 MED ORDER — DIPHENHYDRAMINE HCL 50 MG/ML IJ SOLN: 12.5000 mg | INTRAMUSCULAR | Status: DC | PRN

## 2015-05-09 MED ORDER — LIDOCAINE-EPINEPHRINE (PF) 1.5 %-1:200000 IJ SOLN
INTRAMUSCULAR | Status: DC | PRN
  Administered 2015-05-09: 3 mL via EPIDURAL

## 2015-05-09 MED ORDER — EPHEDRINE 5 MG/ML INJ: 10.0000 mg | INTRAVENOUS | Status: DC | PRN

## 2015-05-09 MED ORDER — FENTANYL 2.5 MCG/ML W/ROPIVACAINE 0.2% IN NS 100 ML EPIDURAL INFUSION (ARMC-ANES)
EPIDURAL | Status: AC
  Administered 2015-05-09: 10 mL/h via EPIDURAL
  Filled 2015-05-09: qty 100

## 2015-05-09 MED ORDER — FENTANYL 2.5 MCG/ML W/ROPIVACAINE 0.2% IN NS 100 ML EPIDURAL INFUSION (ARMC-ANES)
EPIDURAL | Status: AC
  Administered 2015-05-09: 9 mL/h
  Filled 2015-05-09: qty 100

## 2015-05-09 NOTE — Progress Notes (Signed)
Report given to Dr. Marcelline Mates.   Orders received to start amnioinfusion and may increase Pitocin to 30 mlU.   Will call MD back at 0200 with update.

## 2015-05-09 NOTE — Progress Notes (Signed)
Tension to foley bulb.

## 2015-05-09 NOTE — Progress Notes (Signed)
Intrapartum Progress Note  S: Patient complains of pain with contractions. Also reports headache.   O:  Temp:  [97.8 F (36.6 C)-98.6 F (37 C)] 97.8 F (36.6 C) (09/12 0706) Pulse Rate:  [69-93] 71 (09/12 0900) Resp:  [18-20] 18 (09/12 0611) BP: (114-168)/(62-102) 156/85 mmHg (09/12 0900) Weight:  [227 lb (102.967 kg)] 227 lb (102.967 kg) (09/11 1005)  Gen App: NAD, facial swelling present Abdomen: soft, gravid, palpable contraction FHT: baseline135 bpm, accels present, no decels Moderate variability.    Tocometer: contractions q 2-3 min Cervix: 5/50/c/-3/intact. Extremities: Mildly tender bilaterally, 2+ pitting edema  Pitocin: 12 mIU  Labs:  Lab Results  Component Value Date   WBC 8.2 05/08/2015   HGB 12.8 05/08/2015   HCT 37.3 05/08/2015   MCV 92.3 05/08/2015   PLT 190 05/08/2015    Lab Results  Component Value Date   CREATININE 0.64 05/08/2015   BUN <5* 05/08/2015   NA 136 05/08/2015   K 3.7 05/08/2015   CL 107 05/08/2015   CO2 20* 05/08/2015   Lab Results  Component Value Date   ALT 22 05/08/2015   AST 25 05/08/2015   ALKPHOS 138* 05/08/2015   BILITOT 0.9 05/08/2015    Results for Wimberly, DONIA YOKUM (MRN [id number]) as of 05/09/2015 09:28  Ref. Range 05/08/2015 21:55  Total Protein, Urine Latest Units: mg/dL 70  Protein Creatinine Ratio Latest Ref Range: 0.00-0.15 mg/mgCre 1.11 (H)  Creatinine, Urine Latest Units: mg/dL 63   Assessment:  1: SIUP at [redacted]w[redacted]d 2. Pre-eclampsia, now with severe features 3. GBS positive  Plan:  1. Continue IOL for pre-eclampsia. Increase pitocin per protocol for augmentation.  2. Magnesium Sulfate for pre-eclampsia.   3. Percocet for headache, prn. 4. Labetalol for severe range BPs, prn.  5. Patient desires epidural.  6. AROM'd with clear fluid.  7. Anticipate vaginal delivery.    Rubie Maid, MD 05/09/2015 9:25 AM

## 2015-05-09 NOTE — Progress Notes (Signed)
Intrapartum Progress Note  S: Patient reports that she is beginning to feel her contractions again.   O:  Filed Vitals:   05/09/15 1724 05/09/15 1739 05/09/15 1754 05/09/15 1809  BP: 156/82 159/87 152/86 152/88  Pulse: 96 100 96 93  Temp:      TempSrc:      Resp:      Height:      Weight:      SpO2:        Gen App: NAD, comfortable Abdomen: soft, gravid FHT: baseline 135 bpm.  Accels present.  Decels present - occcasional variables, associated with contractions. Moderate variability.   Tocometer: contractions q 4 minutes Cervix:  5/80/-1/AROM Extremities: Nontender, no edema.  Pitocin: 20 mIU  Labs:  No new labs  Assessment:  1: SIUP at [redacted]w[redacted]d 2. Pre-eclampsia, now with severe features 3. GBS positive  Plan:  1. Continue IOL for pre-eclampsia. Increase pitocin per protocol for augmentation.  2. Magnesium Sulfate for pre-eclampsia.  3. Percocet for headache, prn. 4. Labetalol for severe range BPs, prn.  5. Patient s/p epidural.  6. Will place IUPC 7. Anticipate vaginal delivery.    Rubie Maid, MD 05/09/2015 6:45 PM

## 2015-05-09 NOTE — Progress Notes (Signed)
Tension applied to foley bulb.

## 2015-05-09 NOTE — Progress Notes (Signed)
Pt extremely uncomfortable in bed.  Assisted to standing at bedside.  Linens fixed, padding added to crease extra pillows.  While standing at bedside pt thought her water broke.   Nitrazine negative, however with a little tension to foley bulb it became dislodged and fell out.  Pt states she feels better since then.  Pt is now back in bed.  Lights dimmed.  Ice  Chips given.

## 2015-05-09 NOTE — Anesthesia Procedure Notes (Signed)
Epidural Patient location during procedure: OB Start time: 05/09/2015 11:13 AM End time: 05/09/2015 11:15 AM  Staffing Resident/CRNA: Doreen Salvage Performed by: resident/CRNA   Preanesthetic Checklist Completed: patient identified, site marked, surgical consent, pre-op evaluation, timeout performed, IV checked, risks and benefits discussed and monitors and equipment checked  Epidural Patient position: sitting Prep: Betadine Patient monitoring: heart rate, continuous pulse ox and blood pressure Approach: midline Location: L4-L5 Injection technique: LOR saline  Needle:  Needle type: Tuohy  Needle gauge: 18 G Needle length: 9 cm and 9 Needle insertion depth: 6.5 cm Catheter type: closed end flexible Catheter size: 20 Guage Catheter at skin depth: 11 cm Test dose: negative and 1.5% lidocaine with Epi 1:200 K  Assessment Events: blood not aspirated, injection not painful, no injection resistance, negative IV test and no paresthesia  Additional Notes   Patient tolerated the insertion well without complications.Reason for block:procedure for pain

## 2015-05-09 NOTE — Progress Notes (Addendum)
Intrapartum Progress Note  S: Patient comfortable, denies complaints.   O:  Filed Vitals:   05/09/15 1239 05/09/15 1254 05/09/15 1310 05/09/15 1324  BP: 143/68 148/70 157/107   Pulse: 91 89 98 97  Temp:      TempSrc:      Resp:      Height:      Weight:      SpO2: 96%       Gen App: NAD, facial swelling present Abdomen: soft, gravid, palpable contraction FHT: baseline135 bpm, accels present, variable decels present intermittently with contractions, from baseline to 100s with quick recovery. Moderate variability.    Tocometer: contractions q 2-3 min Cervix: 5/50/c/-1/intact. Extremities: Mildly tender bilaterally, 2+ pitting edema  Pitocin: 14 mIU  Labs:  No new labs  Assessment:  1: SIUP at [redacted]w[redacted]d 2. Pre-eclampsia, now with severe features 3. GBS positive  Plan:  1. Continue IOL for pre-eclampsia. Increase pitocin per protocol for augmentation.  2. Magnesium Sulfate for pre-eclampsia.   3. Percocet for headache, prn. 4. Labetalol for severe range BPs, prn.  5. Patient s/p epidural.  6. If no change with next cervical exam, will place IUPC 7. Anticipate vaginal delivery.    Rubie Maid, MD  05/09/2015 1:29 PM

## 2015-05-09 NOTE — Anesthesia Preprocedure Evaluation (Addendum)
Anesthesia Evaluation  Patient identified by MRN, date of birth, ID band Patient awake    Reviewed: Allergy & Precautions, H&P , NPO status , Patient's Chart, lab work & pertinent test results, reviewed documented beta blocker date and time   History of Anesthesia Complications Negative for: history of anesthetic complications  Airway Mallampati: II  TM Distance: >3 FB Neck ROM: full    Dental no notable dental hx. (+) Chipped   Pulmonary former smoker,    Pulmonary exam normal        Cardiovascular hypertension, Normal cardiovascular exam  Mild pre-eclampsia   Neuro/Psych  Headaches,    GI/Hepatic negative GI ROS, Neg liver ROS,   Endo/Other  negative endocrine ROS  Renal/GU negative Renal ROS     Musculoskeletal   Abdominal   Peds  Hematology negative hematology ROS (+)   Anesthesia Other Findings   Reproductive/Obstetrics (+) Pregnancy                            Anesthesia Physical Anesthesia Plan  ASA: II  Anesthesia Plan: Epidural   Post-op Pain Management:    Induction:   Airway Management Planned:   Additional Equipment:   Intra-op Plan:   Post-operative Plan:   Informed Consent: I have reviewed the patients History and Physical, chart, labs and discussed the procedure including the risks, benefits and alternatives for the proposed anesthesia with the patient or authorized representative who has indicated his/her understanding and acceptance.     Plan Discussed with: Anesthesiologist  Anesthesia Plan Comments:        Anesthesia Quick Evaluation

## 2015-05-10 DIAGNOSIS — Z3483 Encounter for supervision of other normal pregnancy, third trimester: Secondary | ICD-10-CM

## 2015-05-10 LAB — COMPREHENSIVE METABOLIC PANEL
ALT: 23 U/L (ref 14–54)
AST: 49 U/L — ABNORMAL HIGH (ref 15–41)
Albumin: 2.4 g/dL — ABNORMAL LOW (ref 3.5–5.0)
Alkaline Phosphatase: 129 U/L — ABNORMAL HIGH (ref 38–126)
Anion gap: 10 (ref 5–15)
BUN: 6 mg/dL (ref 6–20)
CO2: 18 mmol/L — ABNORMAL LOW (ref 22–32)
Calcium: 7.9 mg/dL — ABNORMAL LOW (ref 8.9–10.3)
Chloride: 105 mmol/L (ref 101–111)
Creatinine, Ser: 1.3 mg/dL — ABNORMAL HIGH (ref 0.44–1.00)
GFR calc Af Amer: >60 mL/min (ref >60)
GFR calc non Af Amer: 54 mL/min — ABNORMAL LOW (ref >60)
Glucose, Bld: 118 mg/dL — ABNORMAL HIGH (ref 65–99)
Potassium: 3.6 mmol/L (ref 3.5–5.1)
Sodium: 133 mmol/L — ABNORMAL LOW (ref 135–145)
Total Bilirubin: 0.5 mg/dL (ref 0.3–1.2)
Total Protein: 5.6 g/dL — ABNORMAL LOW (ref 6.5–8.1)

## 2015-05-10 LAB — CBC
HCT: 35.1 % (ref 35.0–47.0)
Hemoglobin: 11.9 g/dL — ABNORMAL LOW (ref 12.0–16.0)
MCH: 31.6 pg (ref 26.0–34.0)
MCHC: 33.9 g/dL (ref 32.0–36.0)
MCV: 93.2 fL (ref 80.0–100.0)
Platelets: 220 10*3/uL (ref 150–440)
RBC: 3.77 MIL/uL — ABNORMAL LOW (ref 3.80–5.20)
RDW: 15 % — ABNORMAL HIGH (ref 11.5–14.5)
WBC: 16 10*3/uL — ABNORMAL HIGH (ref 3.6–11.0)

## 2015-05-10 LAB — URIC ACID: Uric Acid, Serum: 6.8 mg/dL — ABNORMAL HIGH (ref 2.3–6.6)

## 2015-05-10 LAB — CORD BLOOD GAS (ARTERIAL)
Acid-base deficit: 10 mmol/L — ABNORMAL HIGH (ref 0.0–2.0)
Bicarbonate: 19.2 mEq/L — ABNORMAL LOW (ref 21.0–28.0)
pCO2 cord blood (arterial): 55 mmHg (ref 42.0–56.0)
pH cord blood (arterial): 7.15 — CL (ref 7.210–7.380)

## 2015-05-10 MED ORDER — FENTANYL CITRATE (PF) 100 MCG/2ML IJ SOLN: 50.0000 ug | INTRAMUSCULAR | Status: DC | PRN

## 2015-05-10 MED ORDER — SIMETHICONE 80 MG PO CHEW: 80.0000 mg | CHEWABLE_TABLET | ORAL | Status: DC | PRN

## 2015-05-10 MED ORDER — DIBUCAINE 1 % RE OINT: 1.0000 "application " | TOPICAL_OINTMENT | RECTAL | Status: DC | PRN

## 2015-05-10 MED ORDER — ACETAMINOPHEN 325 MG PO TABS: 650.0000 mg | ORAL_TABLET | ORAL | Status: DC | PRN

## 2015-05-10 MED ORDER — ZOLPIDEM TARTRATE 5 MG PO TABS: 5.0000 mg | ORAL_TABLET | Freq: Every evening | ORAL | Status: DC | PRN

## 2015-05-10 MED ORDER — LANOLIN HYDROUS EX OINT: TOPICAL_OINTMENT | CUTANEOUS | Status: DC | PRN

## 2015-05-10 MED ORDER — DIPHENHYDRAMINE HCL 25 MG PO CAPS: 25.0000 mg | ORAL_CAPSULE | Freq: Four times a day (QID) | ORAL | Status: DC | PRN

## 2015-05-10 MED ORDER — PRENATAL MULTIVITAMIN CH
1.0000 | ORAL_TABLET | Freq: Every day | ORAL | Status: DC
  Administered 2015-05-11 – 2015-05-12 (×2): 1 via ORAL
  Filled 2015-05-10 (×4): qty 1

## 2015-05-10 MED ORDER — IBUPROFEN 600 MG PO TABS
600.0000 mg | ORAL_TABLET | Freq: Four times a day (QID) | ORAL | Status: DC
  Administered 2015-05-11 – 2015-05-13 (×10): 600 mg via ORAL
  Filled 2015-05-10 (×10): qty 1

## 2015-05-10 MED ORDER — CARBOPROST TROMETHAMINE 250 MCG/ML IM SOLN
250.0000 ug | INTRAMUSCULAR | Status: DC | PRN
  Filled 2015-05-10: qty 1

## 2015-05-10 MED ORDER — ONDANSETRON HCL 4 MG/2ML IJ SOLN: 4.0000 mg | INTRAMUSCULAR | Status: DC | PRN

## 2015-05-10 MED ORDER — DOCUSATE SODIUM 100 MG PO CAPS
100.0000 mg | ORAL_CAPSULE | Freq: Two times a day (BID) | ORAL | Status: DC
  Administered 2015-05-11 – 2015-05-13 (×6): 100 mg via ORAL
  Filled 2015-05-10 (×6): qty 1

## 2015-05-10 MED ORDER — OXYCODONE-ACETAMINOPHEN 5-325 MG PO TABS
ORAL_TABLET | ORAL | Status: AC
  Administered 2015-05-10: 2 via ORAL
  Filled 2015-05-10: qty 2

## 2015-05-10 MED ORDER — FENTANYL CITRATE (PF) 100 MCG/2ML IJ SOLN
50.0000 ug | Freq: Once | INTRAMUSCULAR | Status: AC
  Administered 2015-05-10: 50 ug via INTRAVENOUS

## 2015-05-10 MED ORDER — BENZOCAINE-MENTHOL 20-0.5 % EX AERO
1.0000 "application " | INHALATION_SPRAY | CUTANEOUS | Status: DC | PRN
  Filled 2015-05-10: qty 56

## 2015-05-10 MED ORDER — LACTATED RINGERS IV SOLN: INTRAVENOUS | Status: DC

## 2015-05-10 MED ORDER — OXYCODONE-ACETAMINOPHEN 5-325 MG PO TABS
2.0000 | ORAL_TABLET | ORAL | Status: DC | PRN
  Administered 2015-05-10: 2 via ORAL
  Filled 2015-05-10: qty 2

## 2015-05-10 MED ORDER — ONDANSETRON HCL 4 MG PO TABS: 4.0000 mg | ORAL_TABLET | ORAL | Status: DC | PRN

## 2015-05-10 MED ORDER — OXYCODONE-ACETAMINOPHEN 5-325 MG PO TABS: 1.0000 | ORAL_TABLET | ORAL | Status: DC | PRN

## 2015-05-10 MED ORDER — FENTANYL CITRATE (PF) 100 MCG/2ML IJ SOLN
INTRAMUSCULAR | Status: AC
  Administered 2015-05-10: 50 ug via INTRAVENOUS
  Filled 2015-05-10: qty 2

## 2015-05-10 MED ORDER — WITCH HAZEL-GLYCERIN EX PADS
1.0000 | MEDICATED_PAD | CUTANEOUS | Status: DC | PRN
Start: 2015-05-10 — End: 2015-05-13

## 2015-05-10 NOTE — Progress Notes (Signed)
PPD #0 after SVD for IOL due to Pre eclampsia.  S: C/O HA, some blurry vision.  :  BP 142/74 mmHg  Pulse 100  Temp(Src) 97.5 F (36.4 C) (Oral)  Resp 18  Ht 5' (1.524 m)  Wt 227 lb (102.967 kg)  BMI 44.33 kg/m2  SpO2 96%  LMP 08/15/2014  Breastfeeding? Unknown Alert oriented. 3+ edema; DTR 1/4 Lochia normal  Urine output: adequate  CBC Latest Ref Rng 05/10/2015 05/09/2015 05/08/2015  WBC 3.6 - 11.0 K/uL 16.0(H) - 8.2  Hemoglobin 12.0 - 16.0 g/dL 11.9(L) - 12.8  Hematocrit 35.0 - 47.0 % 35.1 - 37.3  Platelets 150 - 440 K/uL 220 222 190   CMP Latest Ref Rng 05/10/2015 05/08/2015 05/04/2015  Glucose 65 - 99 mg/dL 118(H) 79 159(H)  BUN 6 - 20 mg/dL 6 <5(L) 5(L)  Creatinine 0.44 - 1.00 mg/dL 1.30(H) 0.64 0.74  Sodium 135 - 145 mmol/L 133(L) 136 134  Potassium 3.5 - 5.1 mmol/L 3.6 3.7 3.7  Chloride 101 - 111 mmol/L 105 107 101  CO2 22 - 32 mmol/L 18(L) 20(L) 15(L)  Calcium 8.9 - 10.3 mg/dL 7.9(L) 8.8(L) 8.8  Total Protein 6.5 - 8.1 g/dL 5.6(L) 6.7 5.7(L)  Albumin 3.5 - 5.5 g/dL - - 3.4(L)  Total Bilirubin 0.3 - 1.2 mg/dL 0.5 0.9 0.3  Alkaline Phos 38 - 126 U/L 129(H) 138(H) 138(H)  AST 15 - 41 U/L 49(H) 25 18  ALT 14 - 54 U/L 23 22 20      A: s/p SVD; Pre eclampsia; Bump in U.A., creatinine, ALT  P: 1. Continue Mag x 24 hours postpartum; watch U.O. For signs of Mag toxicity due to increase in serum Cr. 2. Fentanyl IV  for HA refractory to PO analgesics.  Brayton Mars, MD

## 2015-05-10 NOTE — Progress Notes (Signed)
Pt delivered over 1st degree.   Cord clamped and handed over to nursery personal immediately.

## 2015-05-10 NOTE — Progress Notes (Signed)
Report given to Dr. Marcelline Mates.

## 2015-05-10 NOTE — Progress Notes (Signed)
Epidural cath removed.  Blue tip intacted and witnessed by pt.

## 2015-05-10 NOTE — Progress Notes (Signed)
Dr. Marcelline Mates at bedside.  Pt is positioned for pushing.

## 2015-05-11 ENCOUNTER — Encounter: Payer: BLUE CROSS/BLUE SHIELD | Admitting: Obstetrics and Gynecology

## 2015-05-11 LAB — CBC WITH DIFFERENTIAL/PLATELET
Basophils Absolute: 0 10*3/uL (ref 0–0.1)
Basophils Relative: 0 %
Eosinophils Absolute: 0 10*3/uL (ref 0–0.7)
Eosinophils Relative: 1 %
HCT: 27.6 % — ABNORMAL LOW (ref 35.0–47.0)
Hemoglobin: 9.6 g/dL — ABNORMAL LOW (ref 12.0–16.0)
Lymphocytes Relative: 19 %
Lymphs Abs: 2.1 10*3/uL (ref 1.0–3.6)
MCH: 32.4 pg (ref 26.0–34.0)
MCHC: 34.8 g/dL (ref 32.0–36.0)
MCV: 93 fL (ref 80.0–100.0)
Monocytes Absolute: 1.1 10*3/uL — ABNORMAL HIGH (ref 0.2–0.9)
Monocytes Relative: 10 %
Neutro Abs: 7.6 10*3/uL — ABNORMAL HIGH (ref 1.4–6.5)
Neutrophils Relative %: 70 %
Platelets: 136 10*3/uL — ABNORMAL LOW (ref 150–440)
RBC: 2.97 MIL/uL — ABNORMAL LOW (ref 3.80–5.20)
RDW: 15.2 % — ABNORMAL HIGH (ref 11.5–14.5)
WBC: 10.8 10*3/uL (ref 3.6–11.0)

## 2015-05-11 LAB — COMPREHENSIVE METABOLIC PANEL
ALT: 67 U/L — ABNORMAL HIGH (ref 14–54)
AST: 66 U/L — ABNORMAL HIGH (ref 15–41)
Albumin: 2.1 g/dL — ABNORMAL LOW (ref 3.5–5.0)
Alkaline Phosphatase: 110 U/L (ref 38–126)
Anion gap: 6 (ref 5–15)
BUN: 7 mg/dL (ref 6–20)
CO2: 25 mmol/L (ref 22–32)
Calcium: 7.8 mg/dL — ABNORMAL LOW (ref 8.9–10.3)
Chloride: 110 mmol/L (ref 101–111)
Creatinine, Ser: 0.85 mg/dL (ref 0.44–1.00)
GFR calc Af Amer: >60 mL/min (ref >60)
GFR calc non Af Amer: >60 mL/min (ref >60)
Glucose, Bld: 99 mg/dL (ref 65–99)
Potassium: 3.7 mmol/L (ref 3.5–5.1)
Sodium: 141 mmol/L (ref 135–145)
Total Bilirubin: 0.5 mg/dL (ref 0.3–1.2)
Total Protein: 5.1 g/dL — ABNORMAL LOW (ref 6.5–8.1)

## 2015-05-11 NOTE — Progress Notes (Signed)
Mag off. IV to saline lock,  Pericare done.   SCD's off.  Pt back to sleep

## 2015-05-11 NOTE — Progress Notes (Signed)
Post Partum Day 1 S/P IOL for Pre eclampsia Subjective: up ad lib, voiding and sore; HA gone.  Objective: Blood pressure 150/86, pulse 91, temperature 98.7 F (37.1 C), temperature source Oral, resp. rate 18, height 5' (1.524 m), weight 227 lb (102.967 kg), last menstrual period 08/15/2014, SpO2 96 %, unknown if currently breastfeeding.  Physical Exam:  General: alert and cooperative Lochia: appropriate Uterine Fundus: firm Incision: NA DVT Evaluation: No evidence of DVT seen on physical exam. 3+ edema  CBC Latest Ref Rng 05/11/2015 05/10/2015 05/09/2015  WBC 3.6 - 11.0 K/uL 10.8 16.0(H) -  Hemoglobin 12.0 - 16.0 g/dL 9.6(L) 11.9(L) -  Hematocrit 35.0 - 47.0 % 27.6(L) 35.1 -  Platelets 150 - 440 K/uL 136(L) 220 222   CMP Latest Ref Rng 05/11/2015 05/10/2015 05/08/2015  Glucose 65 - 99 mg/dL 99 118(H) 79  BUN 6 - 20 mg/dL 7 6 <5(L)  Creatinine 0.44 - 1.00 mg/dL 0.85 1.30(H) 0.64  Sodium 135 - 145 mmol/L 141 133(L) 136  Potassium 3.5 - 5.1 mmol/L 3.7 3.6 3.7  Chloride 101 - 111 mmol/L 110 105 107  CO2 22 - 32 mmol/L 25 18(L) 20(L)  Calcium 8.9 - 10.3 mg/dL 7.8(L) 7.9(L) 8.8(L)  Total Protein 6.5 - 8.1 g/dL 5.1(L) 5.6(L) 6.7  Albumin 3.5 - 5.5 g/dL - - -  Total Bilirubin 0.3 - 1.2 mg/dL 0.5 0.5 0.9  Alkaline Phos 38 - 126 U/L 110 129(H) 138(H)  AST 15 - 41 U/L 66(H) 49(H) 25  ALT 14 - 54 U/L 67(H) 23 22    Assessment/Plan: 1. Resolving PreE 2. Kidney function improving; LFT's increased.   1. Watch BP; may need to start antihypertensive 2. Recheck LFT's within 1 week of discharge.   LOS: 3 days   Alanda Slim Leighanna Kirn 05/11/2015, 1:37 PM

## 2015-05-11 NOTE — Progress Notes (Signed)
Notified Renne Crigler, Rn of bp

## 2015-05-11 NOTE — Progress Notes (Signed)
Pt assisted to sitting up and breastfeeding.   No complaints at this time.

## 2015-05-11 NOTE — Anesthesia Postprocedure Evaluation (Signed)
  Anesthesia Post-op Note  Patient: Tonya Wagner  Procedure(s) Performed: CLE  Anesthesia type:Epidural  Patient location: 344  Post pain: Pain level controlled  Post assessment: Post-op Vital signs reviewed, Patient's Cardiovascular Status Stable, Respiratory Function Stable, Patent Airway and No signs of Nausea or vomiting  Post vital signs: Reviewed and stable  Last Vitals:  Filed Vitals:   05/11/15 0822  BP: 141/93  Pulse: 77  Temp: 36.8 C  Resp: 20    Level of consciousness: awake, alert  and patient cooperative  Complications: No apparent anesthesia complications

## 2015-05-11 NOTE — Lactation Note (Signed)
This note was copied from the chart of [facility]. Lactation Consultation Note  Patient Name: Tonya Wagner IONGE'X Date: 05/11/2015 Reason for consult: Follow-up assessment LCs and RNs spent much time helping Mom and this baby with breastfeeding yesterday and today. Mom has flat nipples, but baby has been able to latch and nurse well. She was taught to pre pump to evert nipple; how to use nipple shield (did not work well); use pump when poor breastfeeds 15 minutes Q3 hours; how to supplement baby with syringe for low blood sugars (yesterday)/poor feeds, etc. Mom has not pumped since yesterday and occassional attempts at breastfeeding but now mostly just bottlefeeding formula (including when she and I planned on me helping her with breastfeeding). She has been informed of differences between breast milk and formula; how to make milk; pump options. No more LC follow up unless Mom changes her mind and requests serious LC help.    Maternal Data    Feeding Feeding Type: Breast Fed (attempted breastfeed; sleepy baby)  LATCH Score/Interventions Latch: Too sleepy or reluctant, no latch achieved, no sucking elicited.                    Lactation Tools Discussed/Used     Consult Status      Roque Cash 05/11/2015, 1:04 PM

## 2015-05-12 MED ORDER — LABETALOL HCL 200 MG PO TABS
200.0000 mg | ORAL_TABLET | Freq: Two times a day (BID) | ORAL | Status: DC
  Administered 2015-05-12 – 2015-05-13 (×2): 200 mg via ORAL
  Filled 2015-05-12 (×2): qty 1

## 2015-05-12 NOTE — Progress Notes (Signed)
Post Partum Day 2 Subjective: no complaints, up ad lib, voiding and tolerating PO  Objective: Blood pressure 125/76, pulse 76, temperature 98.4 F (36.9 C), temperature source Oral, resp. rate 20, height 5' (1.524 m), weight 227 lb (102.967 kg), last menstrual period 08/15/2014, SpO2 99 %, unknown if currently breastfeeding.  Physical Exam:  General: alert and cooperative Lochia: appropriate Uterine Fundus: firm Incision: NA DVT Evaluation: No cords or calf tenderness. Calf/Ankle edema is present. 3+   Assessment/Plan: PPD #2 S/P SVD after IOL for Pre Eclampsia Still with Borderline pressures  Start Labetalol 200 mg BID Check Cr and LFT's in am   LOS: 4 days   Hassell Done A Malone Admire 05/12/2015, 9:07 AM

## 2015-05-13 LAB — HEPATIC FUNCTION PANEL
ALT: 177 U/L — ABNORMAL HIGH (ref 14–54)
AST: 151 U/L — ABNORMAL HIGH (ref 15–41)
Albumin: 2.3 g/dL — ABNORMAL LOW (ref 3.5–5.0)
Alkaline Phosphatase: 141 U/L — ABNORMAL HIGH (ref 38–126)
Bilirubin, Direct: <0.1 mg/dL — ABNORMAL LOW (ref 0.1–0.5)
Total Bilirubin: 0.5 mg/dL (ref 0.3–1.2)
Total Protein: 5.4 g/dL — ABNORMAL LOW (ref 6.5–8.1)

## 2015-05-13 LAB — CREATININE, SERUM
Creatinine, Ser: 0.69 mg/dL (ref 0.44–1.00)
GFR calc Af Amer: >60 mL/min (ref >60)
GFR calc non Af Amer: >60 mL/min (ref >60)

## 2015-05-13 MED ORDER — OXYCODONE-ACETAMINOPHEN 5-325 MG PO TABS: 2.0000 | ORAL_TABLET | ORAL | Status: DC | PRN

## 2015-05-13 MED ORDER — HYDROCHLOROTHIAZIDE 25 MG PO TABS: 25.0000 mg | ORAL_TABLET | Freq: Every day | ORAL | Status: DC

## 2015-05-13 MED ORDER — LABETALOL HCL 200 MG PO TABS: 200.0000 mg | ORAL_TABLET | Freq: Two times a day (BID) | ORAL | Status: DC

## 2015-05-13 MED ORDER — IBUPROFEN 600 MG PO TABS: 600.0000 mg | ORAL_TABLET | Freq: Four times a day (QID) | ORAL | Status: DC | PRN

## 2015-05-13 NOTE — Discharge Summary (Signed)
Obstetric Discharge Summary Reason for Admission: induction of labor and PreEclampsia Prenatal Procedures: NST and ultrasound Intrapartum Procedures: spontaneous vaginal delivery and Magnesium Prophylaxis Postpartum Procedures: Magnesium Sulfate prophylaxis; required A[presoline IV x 1 for BP control, started on PO Labetalol with good BP control. Complications-Operative and Postpartum: 2nd degree perineal laceration HEMOGLOBIN  Date Value Ref Range Status  05/11/2015 9.6* 12.0 - 16.0 g/dL Final  10/08/2014 13.7 g/dL Final   HCT  Date Value Ref Range Status  05/11/2015 27.6* 35.0 - 47.0 % Final  10/08/2014 40 % Final   HEMATOCRIT  Date Value Ref Range Status  05/04/2015 33.7* 34.0 - 46.6 % Final    Physical Exam:  General: alert and cooperative Lochia: appropriate Uterine Fundus: firm Incision: NA DVT Evaluation: No evidence of DVT seen on physical exam. 3+ edema (slow diuresis)  Discharge Diagnoses: Term Pregnancy-delivered and Preelampsia  Discharge Information: Date: 05/13/2015 Activity: pelvic rest Diet: routine Medications: PNV, Ibuprofen, Percocet and Labetalol 200 mg BID; HCTZ 25 mg q day (30 days only) Condition: stable Instructions: refer to practice specific booklet Discharge to: home and Return in 1 week for BP check; Needs LFT's checked due to elevation at time of discharge.   Newborn Data: Live born female  Birth Weight: 6 lb 7 oz (2920 g) APGAR: 3, 8  Home with mother and Bottlefeeding.Tonya Wagner 05/13/2015, 8:50 AM

## 2015-05-13 NOTE — Progress Notes (Signed)
Patient discharged home with infant. Discharge instructions, prescriptions and follow up appointment given to and reviewed with patient. Patient verbalized understanding. Escorted out with infant by auxillary.

## 2015-05-19 ENCOUNTER — Encounter: Payer: Self-pay | Admitting: Obstetrics and Gynecology

## 2015-05-19 ENCOUNTER — Ambulatory Visit (INDEPENDENT_AMBULATORY_CARE_PROVIDER_SITE_OTHER): Payer: BLUE CROSS/BLUE SHIELD | Admitting: Obstetrics and Gynecology

## 2015-05-19 VITALS — BP 107/68 | HR 80 | Ht 61.0 in | Wt 197.8 lb

## 2015-05-19 DIAGNOSIS — O1413 Severe pre-eclampsia, third trimester: Secondary | ICD-10-CM

## 2015-05-19 DIAGNOSIS — O1420 HELLP syndrome (HELLP), unspecified trimester: Secondary | ICD-10-CM | POA: Diagnosis not present

## 2015-05-19 DIAGNOSIS — O1424 HELLP syndrome, complicating childbirth: Secondary | ICD-10-CM

## 2015-05-20 LAB — HEPATIC FUNCTION PANEL
ALT: 66 [IU]/L — ABNORMAL HIGH (ref 0–32)
AST: 27 [IU]/L (ref 0–40)
Albumin: 4 g/dL (ref 3.5–5.5)
Alkaline Phosphatase: 202 [IU]/L — ABNORMAL HIGH (ref 39–117)
Bilirubin Total: 0.3 mg/dL (ref 0.0–1.2)
Bilirubin, Direct: 0.11 mg/dL (ref 0.00–0.40)
Total Protein: 7.1 g/dL (ref 6.0–8.5)

## 2015-05-21 DIAGNOSIS — O1423 HELLP syndrome (HELLP), third trimester: Secondary | ICD-10-CM | POA: Insufficient documentation

## 2015-05-21 LAB — COMPREHENSIVE METABOLIC PANEL
ALT: 66 IU/L — ABNORMAL HIGH (ref 0–32)
AST: 22 IU/L (ref 0–40)
Albumin/Globulin Ratio: 1.1 (ref 1.1–2.5)
Albumin: 3.9 g/dL (ref 3.5–5.5)
Alkaline Phosphatase: 208 IU/L — ABNORMAL HIGH (ref 39–117)
BUN/Creatinine Ratio: 12 (ref 8–20)
BUN: 12 mg/dL (ref 6–20)
Bilirubin Total: 0.3 mg/dL (ref 0.0–1.2)
CO2: 19 mmol/L (ref 18–29)
Calcium: 9.7 mg/dL (ref 8.7–10.2)
Chloride: 97 mmol/L (ref 97–108)
Creatinine, Ser: 1.01 mg/dL — ABNORMAL HIGH (ref 0.57–1.00)
GFR calc Af Amer: 85 mL/min/{1.73_m2} (ref >59)
GFR calc non Af Amer: 74 mL/min/{1.73_m2} (ref >59)
Globulin, Total: 3.4 g/dL (ref 1.5–4.5)
Glucose: 87 mg/dL (ref 65–99)
Potassium: 4.6 mmol/L (ref 3.5–5.2)
Sodium: 138 mmol/L (ref 134–144)
Total Protein: 7.3 g/dL (ref 6.0–8.5)

## 2015-05-21 NOTE — Progress Notes (Signed)
GYNECOLOGY PROGRESS NOTE  Subjective:    Patient ID: Tonya Wagner, female    DOB: [birth year], 34 y.o.   MRN: [id number]  HPI  Patient is a 34 y.o. G81P1011 female who presents for 1 week BP check and f/u of hepatic function. Patient is 1 week s/p IOL with SVD at 59 weeks for mild pre-eclampsia that progressed to severe pre-eclampsia with HELLP syndrome, Patient denies complaints major today. Notes compliance with medications.   The following portions of the patient's history were reviewed and updated as appropriate: allergies, current medications, past family history, past medical history, past social history, past surgical history and problem list.  Current Outpatient Prescriptions on File Prior to Visit  Medication Sig Dispense Refill  . Butalbital-APAP-Caffeine 50-300-40 MG CAPS   1  . hydrochlorothiazide (HYDRODIURIL) 25 MG tablet Take 1 tablet (25 mg total) by mouth daily. 30 tablet 0  . labetalol (NORMODYNE) 200 MG tablet Take 1 tablet (200 mg total) by mouth 2 (two) times daily. 60 tablet 1  . Prenatal Vit-Fe Fum-FA-Omega (C-NATE DHA) 28-1-200 MG CAPS   4   No current facility-administered medications on file prior to visit.    Review of Systems A comprehensive review of systems was negative except for: Cardiovascular: positive for lower extremity edema (although improved since discharge from hospital)  Objective:   Blood pressure 107/68, pulse 80, height '5\' 1"'  (1.549 m), weight 197 lb 12.8 oz (89.721 kg), not currently breastfeeding. General appearance: alert and no distress Exam deferred.     CBC Latest Ref Rng 05/11/2015 05/10/2015 05/09/2015  WBC 3.6 - 11.0 K/uL 10.8 16.0(H) -  Hemoglobin 12.0 - 16.0 g/dL 9.6(L) 11.9(L) -  Hematocrit 35.0 - 47.0 % 27.6(L) 35.1 -  Platelets 150 - 440 K/uL 136(L) 220 222    Results for Tonya Wagner, Tonya Wagner (MRN [id number]) as of 05/21/2015 18:00  Ref. Range 05/11/2015 04:45 05/13/2015 06:40  Sodium Latest Ref Range: 135-145 mmol/L  141   Potassium Latest Ref Range: 3.5-5.1 mmol/L 3.7   Chloride Latest Ref Range: 101-111 mmol/L 110   CO2 Latest Ref Range: 22-32 mmol/L 25   BUN Latest Ref Range: 6-20 mg/dL 7   Creatinine Latest Ref Range: 0.44-1.00 mg/dL 0.85 0.69  Calcium Latest Ref Range: 8.9-10.3 mg/dL 7.8 (L)   EGFR (Non-African Amer.) Latest Ref Range: >60 mL/min >60 >60  EGFR (African American) Latest Ref Range: >60 mL/min >60 >60  Glucose Latest Ref Range: 65-99 mg/dL 99   Anion gap Latest Ref Range: 5-15  6   Alkaline Phosphatase Latest Ref Range: 38-126 U/L 110 141 (H)  Albumin Latest Ref Range: 3.5-5.0 g/dL 2.1 (L) 2.3 (L)  AST Latest Ref Range: 15-41 U/L 66 (H) 151 (H)  ALT Latest Ref Range: 14-54 U/L 67 (H) 177 (H)  Total Protein Latest Ref Range: 6.5-8.1 g/dL 5.1 (L) 5.4 (L)  Bilirubin, Direct Latest Ref Range: 0.1-0.5 mg/dL  <0.1 (L)  Indirect Bilirubin Latest Ref Range: 0.3-0.9 mg/dL  NOT CALCULATED  Total Bilirubin Latest Ref Range: 0.3-1.2 mg/dL 0.5 0.5    Assessment:  1 week postpartum s/p SVD, postpartum course complicated by severe pre-eclampsia with HELLP syndrome  Plan:  BP's well controlled on medications, to continue until 6 weeks postpartum.  Will reorder hepatic panel to f/u liver enzymes.  Reiterated s/s of pre-eclampsia/HELLP syndrome.  F/u in 5 weeks.    Rubie Maid, MD [facility]

## 2015-06-11 LAB — SPECIMEN STATUS REPORT

## 2015-06-16 ENCOUNTER — Ambulatory Visit (INDEPENDENT_AMBULATORY_CARE_PROVIDER_SITE_OTHER): Payer: BLUE CROSS/BLUE SHIELD | Admitting: Obstetrics and Gynecology

## 2015-06-16 ENCOUNTER — Encounter: Payer: Self-pay | Admitting: Obstetrics and Gynecology

## 2015-06-16 VITALS — BP 116/73 | HR 76 | Resp 14 | Ht 61.0 in | Wt 191.6 lb

## 2015-06-16 DIAGNOSIS — F53 Postpartum depression: Secondary | ICD-10-CM

## 2015-06-16 DIAGNOSIS — O99345 Other mental disorders complicating the puerperium: Secondary | ICD-10-CM

## 2015-06-16 DIAGNOSIS — D6489 Other specified anemias: Secondary | ICD-10-CM

## 2015-06-16 NOTE — Progress Notes (Signed)
OBSTETRIC POSTPARTUM EXAM  Subjective:     Tonya Wagner is a 34 y.o. G67P1011 female who presents for a postpartum visit. She is 6 weeks postpartum following a spontaneous vaginal delivery. I have fully reviewed the prenatal and intrapartum course. The delivery was at 65 gestational weeks.  Patient underwent induction for mild pre-eclampsia that progressed to severe intrapartum. Was treated with Magnesium Sulfate, discharged home with Labetalol and HCTZ.  Also had panic attack (primary episode) while in hospital.  Anesthesia: epidural. Postpartum course has been well. Baby's course has been well. Baby is feeding by bottle. Bleeding no bleeding.  Patient has not yet resumed menses. Bowel function is normal. Bladder function is normal. Patient is not sexually active. Contraception method desired is Paraguard IUD. Postpartum depression screening: positive (PHQ-9 score is 4).  The following portions of the patient's history were reviewed and updated as appropriate: allergies, current medications, past family history, past medical history, past social history, past surgical history and problem list.  Review of Systems Pertinent items noted in HPI and remainder of comprehensive ROS otherwise negative.   Objective:    BP 116/73 mmHg  Pulse 76  Resp 14  Ht 5\' 1"  (1.549 m)  Wt 191 lb 9.6 oz (86.909 kg)  BMI 36.22 kg/m2  Breastfeeding? No  General:  alert and no distress   Breasts:  inspection negative, no nipple discharge or bleeding, no masses or nodularity palpable  Lungs: clear to auscultation bilaterally  Heart:  regular rate and rhythm, S1, S2 normal, no murmur, click, rub or gallop  Abdomen: soft, non-tender; bowel sounds normal; no masses,  no organomegaly   Vulva:  normal  Vagina: normal vagina, no discharge, exudate, lesion, or erythema  Cervix:  multiparous appearance, no cervical motion tenderness and no lesions  Corpus: enlarged, 12-14 weeks size and irregular  Adnexa:   normal adnexa and no mass, fullness, tenderness  Rectal Exam: Not performed.          Labs:   Lab Results  Component Value Date   HGB 9.6* 05/11/2015    Assessment:   Routine postpartum exam. Pap smear not done at today's visit.   Anemia of pregnancy Severe pre-eclampsia in pregnancy Postpartum blues/mild depression Enlarged fibroid uterus  Plan:   1. Contraception: desires Paraguard IUD.  To f/u in 2 weeks for placement.  2. Will check Hgb to ensure resolution of anemia 3. Patient can d/c BP meds at this time.  4. Discussion had on postpartum blues/mild depression.  Advised on counseling.  Patient declines at this time.  Will continue to monitor symptoms.  5. Fibroid uterus - asymptomatic, diagnosed during pregnancy.  No interventions necessary at this time.   6. Follow up in: 3 months for annual exam, or as needed.      Rubie Maid, MD [facility]

## 2015-06-17 LAB — HEMOGLOBIN: Hemoglobin: 12.3 g/dL (ref 11.1–15.9)

## 2015-07-05 ENCOUNTER — Ambulatory Visit (INDEPENDENT_AMBULATORY_CARE_PROVIDER_SITE_OTHER): Payer: BLUE CROSS/BLUE SHIELD | Admitting: Obstetrics and Gynecology

## 2015-07-05 ENCOUNTER — Encounter: Payer: Self-pay | Admitting: Obstetrics and Gynecology

## 2015-07-05 VITALS — BP 114/72 | HR 70 | Ht 61.0 in | Wt 190.1 lb

## 2015-07-05 DIAGNOSIS — Z30430 Encounter for insertion of intrauterine contraceptive device: Secondary | ICD-10-CM

## 2015-07-05 LAB — POCT URINE PREGNANCY: Preg Test, Ur: NEGATIVE

## 2015-07-05 NOTE — Patient Instructions (Signed)

## 2015-07-05 NOTE — Progress Notes (Signed)
GYNECOLOGY CLINIC PROCEDURE NOTE  Tonya Wagner is a 34 y.o. G2P1011 here for ParaguardIUD insertion. No GYN concerns.  Last pap smear was on  and was normal.  Patient's last menstrual period was 07/01/2015.   The following portions of the patient's history were reviewed and updated as appropriate: allergies, current medications, past family history, past medical history, past social history, past surgical history and problem list.  Vitals:  Blood pressure 114/72, pulse 70, height 5\' 1"  (1.549 m), weight 190 lb 1.6 oz (86.229 kg), last menstrual period 07/01/2015, not currently breastfeeding.  IUD Insertion Procedure Note Patient identified, informed consent performed, consent signed.   Discussed risks of irregular bleeding, cramping, infection, malpositioning or misplacement of the IUD outside the uterus which may require further procedure such as laparoscopy. Time out was performed.  Urine pregnancy test not done, currently on menses.  Speculum placed in the vagina.  Cervix visualized.  Cleaned with Betadine x 2.  Grasped anteriorly with a single tooth tenaculum.  Uterus sounded to 9 cm.  Paraguard IUD placed per manufacturer's recommendations.  Strings trimmed to 3 cm. Tenaculum was removed, good hemostasis noted.  Patient tolerated procedure well.   Patient was given post-procedure instructions.  She was advised to have backup contraception for one week.  Patient was also asked to check IUD strings periodically and follow up in 4 weeks for IUD check.    Rubie Maid, MD [facility]

## 2015-08-03 ENCOUNTER — Encounter: Payer: Self-pay | Admitting: Obstetrics and Gynecology

## 2015-08-03 ENCOUNTER — Ambulatory Visit (INDEPENDENT_AMBULATORY_CARE_PROVIDER_SITE_OTHER): Payer: BLUE CROSS/BLUE SHIELD | Admitting: Obstetrics and Gynecology

## 2015-08-03 VITALS — BP 115/79 | HR 78 | Ht 61.0 in | Wt 190.2 lb

## 2015-08-03 DIAGNOSIS — Z30431 Encounter for routine checking of intrauterine contraceptive device: Secondary | ICD-10-CM

## 2015-08-03 DIAGNOSIS — N61 Mastitis without abscess: Secondary | ICD-10-CM | POA: Diagnosis not present

## 2015-08-03 MED ORDER — DICLOXACILLIN SODIUM 250 MG PO CAPS: 250.0000 mg | ORAL_CAPSULE | Freq: Four times a day (QID) | ORAL | Status: DC

## 2015-08-03 NOTE — Progress Notes (Signed)
GYNECOLOGY CLINIC PROGRESS NOTE  History:  34 y.o. G2P1011 here today for today for IUD string check; Paraguard IUD was placed 07/05/2015 . Patient complains about most recent menstrual cycle being heavier than normal; no concerning side effects.  The following portions of the patient's history were reviewed and updated as appropriate: allergies, current medications, past family history, past medical history, past social history, past surgical history and problem list. Last pap smear was 2016 normal.    Review of Systems:   A comprehensive review of systems was normal except that noted in HPI and Breast ROS: positive for - new or changing breast lumps (in left breast)  Objective:  Physical Exam Blood pressure 115/79, pulse 78, height 5\' 1"  (1.549 m), weight 190 lb 3.2 oz (86.274 kg), last menstrual period 07/01/2015, not currently breastfeeding. CONSTITUTIONAL: Well-developed, well-nourished female in no acute distress.  NECK: Normal range of motion, supple, no masses CARDIOVASCULAR: Normal heart rate noted RESPIRATORY: Effort and breath sounds normal, no problems with respiration noted BREAST: right breast normal without mass, skin or nipple changes or axillary nodes, abnormal mass palpable in left breast underneath areola, tender to touch, with erythema at inner quadrants of breast. ABDOMEN: Soft, no distention noted.   PELVIC: Normal appearing external genitalia; normal appearing vaginal mucosa and cervix.  IUD strings visualized, about 0.5 cm in length outside cervix. Attempted to retrieve from endocervical canal with cytobrush without success.   Assessment & Plan:  Shortened IUD strings.   Patient to keep IUD in place for ten years; can come in for removal if she desires pregnancy within the next ten years. Prescribed Dicloxacillin and warm compresses for breast mass (suspicious for mastitis with possible breast abscess).  If no resolution in 1 week, will order breast  ultrasound. Routine preventative health maintenance measures emphasized.   Rubie Maid, MD [facility]

## 2015-10-18 ENCOUNTER — Encounter: Payer: BLUE CROSS/BLUE SHIELD | Admitting: Obstetrics and Gynecology

## 2015-10-18 ENCOUNTER — Encounter: Payer: Self-pay | Admitting: Obstetrics and Gynecology

## 2015-10-18 VITALS — BP 118/70 | HR 74 | Ht 61.0 in | Wt 194.9 lb

## 2015-10-20 LAB — BASIC METABOLIC PANEL
BUN/Creatinine Ratio: 10 (ref 8–20)
BUN: 10 mg/dL (ref 6–20)
CO2: 23 mmol/L (ref 18–29)
Calcium: 9.8 mg/dL (ref 8.7–10.2)
Chloride: 103 mmol/L (ref 96–106)
Creatinine, Ser: 0.98 mg/dL (ref 0.57–1.00)
GFR calc Af Amer: 88 mL/min/{1.73_m2} (ref >59)
GFR calc non Af Amer: 76 mL/min/{1.73_m2} (ref >59)
Glucose: 101 mg/dL — ABNORMAL HIGH (ref 65–99)
Potassium: 4.4 mmol/L (ref 3.5–5.2)
Sodium: 145 mmol/L — ABNORMAL HIGH (ref 134–144)

## 2015-10-20 LAB — LIPID PANEL
Chol/HDL Ratio: 2.8 ratio units (ref 0.0–4.4)
Cholesterol, Total: 170 mg/dL (ref 100–199)
HDL: 60 mg/dL (ref >39)
LDL Calculated: 58 mg/dL (ref 0–99)
Triglycerides: 260 mg/dL — ABNORMAL HIGH (ref 0–149)
VLDL Cholesterol Cal: 52 mg/dL — ABNORMAL HIGH (ref 5–40)

## 2015-10-20 LAB — THYROID PANEL WITH TSH
Free Thyroxine Index: 2.2 (ref 1.2–4.9)
T3 Uptake Ratio: 27 % (ref 24–39)
T4, Total: 8.3 ug/dL (ref 4.5–12.0)
TSH: 1.27 u[IU]/mL (ref 0.450–4.500)

## 2015-10-20 LAB — HEP, RPR, HIV PANEL
HIV Screen 4th Generation wRfx: NONREACTIVE
Hepatitis B Surface Ag: NEGATIVE
RPR Ser Ql: NONREACTIVE

## 2015-10-20 LAB — HEMOGLOBIN A1C
Est. average glucose Bld gHb Est-mCnc: 123 mg/dL
Hgb A1c MFr Bld: 5.9 % — ABNORMAL HIGH (ref 4.8–5.6)

## 2015-10-20 LAB — VITAMIN D 25 HYDROXY (VIT D DEFICIENCY, FRACTURES): Vit D, 25-Hydroxy: 9.7 ng/mL — ABNORMAL LOW (ref 30.0–100.0)

## 2015-10-21 ENCOUNTER — Encounter: Payer: BLUE CROSS/BLUE SHIELD | Admitting: Obstetrics and Gynecology

## 2015-10-27 ENCOUNTER — Telehealth: Payer: Self-pay

## 2015-10-27 DIAGNOSIS — E559 Vitamin D deficiency, unspecified: Secondary | ICD-10-CM

## 2015-10-27 MED ORDER — VITAMIN D (ERGOCALCIFEROL) 1.25 MG (50000 UNIT) PO CAPS: 50000.0000 [IU] | ORAL_CAPSULE | ORAL | Status: DC

## 2015-10-27 NOTE — Telephone Encounter (Signed)
-----   Message from Rubie Maid, MD sent at 10/24/2015  1:40 PM EST ----- Please inform patient of the following abnormal annual labs:  1) HgbA1c 5.9 (pre-diabetes level).  Recommend low fat, low carb/sugars diet, exercise (at least 3-4 times daily.  2) Elevated lipids (specifically triglycerides and and VLDL).  Recommend exercise, low cholesterol diet, and can begin taking a daily fish oil supplement.  3) Vitamin D deficiency.  Needs to take Vit D 50,000 IU weekly x 12 weeks, followed by 1000 mg daily. Can repeat levels in 3 months.

## 2015-10-27 NOTE — Telephone Encounter (Signed)
Called pt informed her of all the information below, RX sent in for vitamin d.

## 2015-11-10 ENCOUNTER — Encounter: Payer: BLUE CROSS/BLUE SHIELD | Admitting: Obstetrics and Gynecology

## 2015-11-22 ENCOUNTER — Encounter: Payer: BLUE CROSS/BLUE SHIELD | Admitting: Obstetrics and Gynecology

## 2015-12-22 ENCOUNTER — Ambulatory Visit (INDEPENDENT_AMBULATORY_CARE_PROVIDER_SITE_OTHER): Payer: BLUE CROSS/BLUE SHIELD | Admitting: Obstetrics and Gynecology

## 2015-12-22 ENCOUNTER — Encounter: Payer: Self-pay | Admitting: Obstetrics and Gynecology

## 2015-12-22 VITALS — BP 95/63 | HR 62 | Ht 61.0 in | Wt 185.4 lb

## 2015-12-22 DIAGNOSIS — Z124 Encounter for screening for malignant neoplasm of cervix: Secondary | ICD-10-CM

## 2015-12-22 DIAGNOSIS — Z01419 Encounter for gynecological examination (general) (routine) without abnormal findings: Secondary | ICD-10-CM

## 2015-12-22 DIAGNOSIS — R7303 Prediabetes: Secondary | ICD-10-CM | POA: Diagnosis not present

## 2015-12-22 DIAGNOSIS — E669 Obesity, unspecified: Secondary | ICD-10-CM | POA: Diagnosis not present

## 2015-12-22 NOTE — Patient Instructions (Addendum)
RE: MyChart  Dear Ms. Downs  We are excited to introduce MyChart, a new best-in-class service that provides you online access to important information in your electronic medical record. We want to make it easier for you to view your health information - all in one secure location - when and where you need it. We expect MyChart will enhance the quality of care and service we provide. Use the activation code below to enroll in MyChart online at [email or web address]  When you register for MyChart, you can:  Marland Kitchen View your test results. . Communicate securely with your physician's office.  . View your medical history, allergies, medications, and immunizations. . Conveniently print information such as your medication lists.  If you are age 33 or older and want a member of your family to have access to your record, you must provide written consent by completing a proxy form available at our facility. Please speak to our clinical staff about guidelines regarding accounts for patients younger than age 8.  As you activate your MyChart account and need any technical assistance, please call the MyChart technical support line at [phone]) or email your question to [email or web address]. If you email your question(s), please include your name, a return phone number and the best time to reach you.  Thank you for using MyChart as your new health and wellness resource!  MyChart Activation Code:  [email or web address] Expires: 02/20/2016  9:49 AM           [facility]  [place], [place] [address]  Preventive Care for Adults, Female A healthy lifestyle and preventive care can promote health and wellness. Preventive health guidelines for women include the following key practices.  A routine yearly physical is a good way to check with your health care provider about your health and preventive screening. It is a chance to share any concerns and updates  on your health and to receive a thorough exam.  Visit your dentist for a routine exam and preventive care every 6 months. Brush your teeth twice a day and floss once a day. Good oral hygiene prevents tooth decay and gum disease.  The frequency of eye exams is based on your age, health, family medical history, use of contact lenses, and other factors. Follow your health care provider's recommendations for frequency of eye exams.  Eat a healthy diet. Foods like vegetables, fruits, whole grains, low-fat dairy products, and lean protein foods contain the nutrients you need without too many calories. Decrease your intake of foods high in solid fats, added sugars, and salt. Eat the right amount of calories for you.Get information about a proper diet from your health care provider, if necessary.  Regular physical exercise is one of the most important things you can do for your health. Most adults should get at least 150 minutes of moderate-intensity exercise (any activity that increases your heart rate and causes you to sweat) each week. In addition, most adults need muscle-strengthening exercises on 2 or more days a week.  Maintain a healthy weight. The body mass index (BMI) is a screening tool to identify possible weight problems. It provides an estimate of body fat based on height and weight. Your health care provider can find your BMI and can help you achieve or maintain a healthy weight.For adults 20 years and older:  A BMI below 18.5 is considered underweight.  A BMI of 18.5 to 24.9 is normal.  A BMI of 25 to 29.9 is considered  overweight.  A BMI of 30 and above is considered obese.  Maintain normal blood lipids and cholesterol levels by exercising and minimizing your intake of saturated fat. Eat a balanced diet with plenty of fruit and vegetables. Blood tests for lipids and cholesterol should begin at age 37 and be repeated every 5 years. If your lipid or cholesterol levels are high, you are  over 50, or you are at high risk for heart disease, you may need your cholesterol levels checked more frequently.Ongoing high lipid and cholesterol levels should be treated with medicines if diet and exercise are not working.  If you smoke, find out from your health care provider how to quit. If you do not use tobacco, do not start.  Lung cancer screening is recommended for adults aged 53-80 years who are at high risk for developing lung cancer because of a history of smoking. A yearly low-dose CT scan of the lungs is recommended for people who have at least a 30-pack-year history of smoking and are a current smoker or have quit within the past 15 years. A pack year of smoking is smoking an average of 1 pack of cigarettes a day for 1 year (for example: 1 pack a day for 30 years or 2 packs a day for 15 years). Yearly screening should continue until the smoker has stopped smoking for at least 15 years. Yearly screening should be stopped for people who develop a health problem that would prevent them from having lung cancer treatment.  If you are pregnant, do not drink alcohol. If you are breastfeeding, be very cautious about drinking alcohol. If you are not pregnant and choose to drink alcohol, do not have more than 1 drink per day. One drink is considered to be 12 ounces (355 mL) of beer, 5 ounces (148 mL) of wine, or 1.5 ounces (44 mL) of liquor.  Avoid use of street drugs. Do not share needles with anyone. Ask for help if you need support or instructions about stopping the use of drugs.  High blood pressure causes heart disease and increases the risk of stroke. Your blood pressure should be checked at least every 1 to 2 years. Ongoing high blood pressure should be treated with medicines if weight loss and exercise do not work.  If you are 33-73 years old, ask your health care provider if you should take aspirin to prevent strokes.  Diabetes screening is done by taking a blood sample to check your  blood glucose level after you have not eaten for a certain period of time (fasting). If you are not overweight and you do not have risk factors for diabetes, you should be screened once every 3 years starting at age 52. If you are overweight or obese and you are 90-17 years of age, you should be screened for diabetes every year as part of your cardiovascular risk assessment.  Breast cancer screening is essential preventive care for women. You should practice "breast self-awareness." This means understanding the normal appearance and feel of your breasts and may include breast self-examination. Any changes detected, no matter how small, should be reported to a health care provider. Women in their 72s and 30s should have a clinical breast exam (CBE) by a health care provider as part of a regular health exam every 1 to 3 years. After age 35, women should have a CBE every year. Starting at age 47, women should consider having a mammogram (breast X-ray test) every year. Women who have a family  history of breast cancer should talk to their health care provider about genetic screening. Women at a high risk of breast cancer should talk to their health care providers about having an MRI and a mammogram every year.  Breast cancer gene (BRCA)-related cancer risk assessment is recommended for women who have family members with BRCA-related cancers. BRCA-related cancers include breast, ovarian, tubal, and peritoneal cancers. Having family members with these cancers may be associated with an increased risk for harmful changes (mutations) in the breast cancer genes BRCA1 and BRCA2. Results of the assessment will determine the need for genetic counseling and BRCA1 and BRCA2 testing.  Your health care provider may recommend that you be screened regularly for cancer of the pelvic organs (ovaries, uterus, and vagina). This screening involves a pelvic examination, including checking for microscopic changes to the surface of your  cervix (Pap test). You may be encouraged to have this screening done every 3 years, beginning at age 54.  For women ages 68-65, health care providers may recommend pelvic exams and Pap testing every 3 years, or they may recommend the Pap and pelvic exam, combined with testing for human papilloma virus (HPV), every 5 years. Some types of HPV increase your risk of cervical cancer. Testing for HPV may also be done on women of any age with unclear Pap test results.  Other health care providers may not recommend any screening for nonpregnant women who are considered low risk for pelvic cancer and who do not have symptoms. Ask your health care provider if a screening pelvic exam is right for you.  If you have had past treatment for cervical cancer or a condition that could lead to cancer, you need Pap tests and screening for cancer for at least 20 years after your treatment. If Pap tests have been discontinued, your risk factors (such as having a new sexual partner) need to be reassessed to determine if screening should resume. Some women have medical problems that increase the chance of getting cervical cancer. In these cases, your health care provider may recommend more frequent screening and Pap tests.  Colorectal cancer can be detected and often prevented. Most routine colorectal cancer screening begins at the age of 65 years and continues through age 87 years. However, your health care provider may recommend screening at an earlier age if you have risk factors for colon cancer. On a yearly basis, your health care provider may provide home test kits to check for hidden blood in the stool. Use of a small camera at the end of a tube, to directly examine the colon (sigmoidoscopy or colonoscopy), can detect the earliest forms of colorectal cancer. Talk to your health care provider about this at age 42, when routine screening begins. Direct exam of the colon should be repeated every 5-10 years through age 41  years, unless early forms of precancerous polyps or small growths are found.  People who are at an increased risk for hepatitis B should be screened for this virus. You are considered at high risk for hepatitis B if:  You were born in a country where hepatitis B occurs often. Talk with your health care provider about which countries are considered high risk.  Your parents were born in a high-risk country and you have not received a shot to protect against hepatitis B (hepatitis B vaccine).  You have HIV or AIDS.  You use needles to inject street drugs.  You live with, or have sex with, someone who has hepatitis B.  You get hemodialysis treatment.  You take certain medicines for conditions like cancer, organ transplantation, and autoimmune conditions.  Hepatitis C blood testing is recommended for all people born from [birth year] through 1963 and any individual with known risks for hepatitis C.  Practice safe sex. Use condoms and avoid high-risk sexual practices to reduce the spread of sexually transmitted infections (STIs). STIs include gonorrhea, chlamydia, syphilis, trichomonas, herpes, HPV, and human immunodeficiency virus (HIV). Herpes, HIV, and HPV are viral illnesses that have no cure. They can result in disability, cancer, and death.  You should be screened for sexually transmitted illnesses (STIs) including gonorrhea and chlamydia if:  You are sexually active and are younger than 24 years.  You are older than 24 years and your health care provider tells you that you are at risk for this type of infection.  Your sexual activity has changed since you were last screened and you are at an increased risk for chlamydia or gonorrhea. Ask your health care provider if you are at risk.  If you are at risk of being infected with HIV, it is recommended that you take a prescription medicine daily to prevent HIV infection. This is called preexposure prophylaxis (PrEP). You are considered at risk  if:  You are sexually active and do not regularly use condoms or know the HIV status of your partner(s).  You take drugs by injection.  You are sexually active with a partner who has HIV.  Talk with your health care provider about whether you are at high risk of being infected with HIV. If you choose to begin PrEP, you should first be tested for HIV. You should then be tested every 3 months for as long as you are taking PrEP.  Osteoporosis is a disease in which the bones lose minerals and strength with aging. This can result in serious bone fractures or breaks. The risk of osteoporosis can be identified using a bone density scan. Women ages 29 years and over and women at risk for fractures or osteoporosis should discuss screening with their health care providers. Ask your health care provider whether you should take a calcium supplement or vitamin D to reduce the rate of osteoporosis.  Menopause can be associated with physical symptoms and risks. Hormone replacement therapy is available to decrease symptoms and risks. You should talk to your health care provider about whether hormone replacement therapy is right for you.  Use sunscreen. Apply sunscreen liberally and repeatedly throughout the day. You should seek shade when your shadow is shorter than you. Protect yourself by wearing long sleeves, pants, a wide-brimmed hat, and sunglasses year round, whenever you are outdoors.  Once a month, do a whole body skin exam, using a mirror to look at the skin on your back. Tell your health care provider of new moles, moles that have irregular borders, moles that are larger than a pencil eraser, or moles that have changed in shape or color.  Stay current with required vaccines (immunizations).  Influenza vaccine. All adults should be immunized every year.  Tetanus, diphtheria, and acellular pertussis (Td, Tdap) vaccine. Pregnant women should receive 1 dose of Tdap vaccine during each pregnancy. The dose  should be obtained regardless of the length of time since the last dose. Immunization is preferred during the 27th-36th week of gestation. An adult who has not previously received Tdap or who does not know her vaccine status should receive 1 dose of Tdap. This initial dose should be followed by tetanus and diphtheria  toxoids (Td) booster doses every 10 years. Adults with an unknown or incomplete history of completing a 3-dose immunization series with Td-containing vaccines should begin or complete a primary immunization series including a Tdap dose. Adults should receive a Td booster every 10 years.  Varicella vaccine. An adult without evidence of immunity to varicella should receive 2 doses or a second dose if she has previously received 1 dose. Pregnant females who do not have evidence of immunity should receive the first dose after pregnancy. This first dose should be obtained before leaving the health care facility. The second dose should be obtained 4-8 weeks after the first dose.  Human papillomavirus (HPV) vaccine. Females aged 13-26 years who have not received the vaccine previously should obtain the 3-dose series. The vaccine is not recommended for use in pregnant females. However, pregnancy testing is not needed before receiving a dose. If a female is found to be pregnant after receiving a dose, no treatment is needed. In that case, the remaining doses should be delayed until after the pregnancy. Immunization is recommended for any person with an immunocompromised condition through the age of 16 years if she did not get any or all doses earlier. During the 3-dose series, the second dose should be obtained 4-8 weeks after the first dose. The third dose should be obtained 24 weeks after the first dose and 16 weeks after the second dose.  Zoster vaccine. One dose is recommended for adults aged 12 years or older unless certain conditions are present.  Measles, mumps, and rubella (MMR) vaccine. Adults  born before [birth year] generally are considered immune to measles and mumps. Adults born in [birth year] or later should have 1 or more doses of MMR vaccine unless there is a contraindication to the vaccine or there is laboratory evidence of immunity to each of the three diseases. A routine second dose of MMR vaccine should be obtained at least 28 days after the first dose for students attending postsecondary schools, health care workers, or international travelers. People who received inactivated measles vaccine or an unknown type of measles vaccine during 1963-1967 should receive 2 doses of MMR vaccine. People who received inactivated mumps vaccine or an unknown type of mumps vaccine before 1979 and are at high risk for mumps infection should consider immunization with 2 doses of MMR vaccine. For females of childbearing age, rubella immunity should be determined. If there is no evidence of immunity, females who are not pregnant should be vaccinated. If there is no evidence of immunity, females who are pregnant should delay immunization until after pregnancy. Unvaccinated health care workers born before [birth year] who lack laboratory evidence of measles, mumps, or rubella immunity or laboratory confirmation of disease should consider measles and mumps immunization with 2 doses of MMR vaccine or rubella immunization with 1 dose of MMR vaccine.  Pneumococcal 13-valent conjugate (PCV13) vaccine. When indicated, a person who is uncertain of his immunization history and has no record of immunization should receive the PCV13 vaccine. All adults 47 years of age and older should receive this vaccine. An adult aged 54 years or older who has certain medical conditions and has not been previously immunized should receive 1 dose of PCV13 vaccine. This PCV13 should be followed with a dose of pneumococcal polysaccharide (PPSV23) vaccine. Adults who are at high risk for pneumococcal disease should obtain the PPSV23 vaccine at least 8 weeks after  the dose of PCV13 vaccine. Adults older than 33 years of age who have normal immune system function  should obtain the PPSV23 vaccine dose at least 1 year after the dose of PCV13 vaccine.  Pneumococcal polysaccharide (PPSV23) vaccine. When PCV13 is also indicated, PCV13 should be obtained first. All adults aged 74 years and older should be immunized. An adult younger than age 78 years who has certain medical conditions should be immunized. Any person who resides in a nursing home or long-term care facility should be immunized. An adult smoker should be immunized. People with an immunocompromised condition and certain other conditions should receive both PCV13 and PPSV23 vaccines. People with human immunodeficiency virus (HIV) infection should be immunized as soon as possible after diagnosis. Immunization during chemotherapy or radiation therapy should be avoided. Routine use of PPSV23 vaccine is not recommended for American Indians, [place] Natives, or people younger than 65 years unless there are medical conditions that require PPSV23 vaccine. When indicated, people who have unknown immunization and have no record of immunization should receive PPSV23 vaccine. One-time revaccination 5 years after the first dose of PPSV23 is recommended for people aged 19-64 years who have chronic kidney failure, nephrotic syndrome, asplenia, or immunocompromised conditions. People who received 1-2 doses of PPSV23 before age 71 years should receive another dose of PPSV23 vaccine at age 53 years or later if at least 5 years have passed since the previous dose. Doses of PPSV23 are not needed for people immunized with PPSV23 at or after age 67 years.  Meningococcal vaccine. Adults with asplenia or persistent complement component deficiencies should receive 2 doses of quadrivalent meningococcal conjugate (MenACWY-D) vaccine. The doses should be obtained at least 2 months apart. Microbiologists working with certain meningococcal  bacteria, [place] recruits, people at risk during an outbreak, and people who travel to or live in countries with a high rate of meningitis should be immunized. A first-year college student up through age 34 years who is living in a residence hall should receive a dose if she did not receive a dose on or after her 16th birthday. Adults who have certain high-risk conditions should receive one or more doses of vaccine.  Hepatitis A vaccine. Adults who wish to be protected from this disease, have certain high-risk conditions, work with hepatitis A-infected animals, work in hepatitis A research labs, or travel to or work in countries with a high rate of hepatitis A should be immunized. Adults who were previously unvaccinated and who anticipate close contact with an international adoptee during the first 60 days after arrival in the [place] from a country with a high rate of hepatitis A should be immunized.  Hepatitis B vaccine. Adults who wish to be protected from this disease, have certain high-risk conditions, may be exposed to blood or other infectious body fluids, are household contacts or sex partners of hepatitis B positive people, are clients or workers in certain care facilities, or travel to or work in countries with a high rate of hepatitis B should be immunized.  Haemophilus influenzae type b (Hib) vaccine. A previously unvaccinated person with asplenia or sickle cell disease or having a scheduled splenectomy should receive 1 dose of Hib vaccine. Regardless of previous immunization, a recipient of a hematopoietic stem cell transplant should receive a 3-dose series 6-12 months after her successful transplant. Hib vaccine is not recommended for adults with HIV infection. Preventive Services / Frequency Ages 74 to 63 years  Blood pressure check.** / Every 3-5 years.  Lipid and cholesterol check.** / Every 5 years beginning at age 18.  Clinical breast exam.** / Every 3 years  for women in  their 36s and 3s.  BRCA-related cancer risk assessment.** / For women who have family members with a BRCA-related cancer (breast, ovarian, tubal, or peritoneal cancers).  Pap test.** / Every 2 years from ages 3 through 57. Every 3 years starting at age 39 through age 44 or 25 with a history of 3 consecutive normal Pap tests.  HPV screening.** / Every 3 years from ages 12 through ages 85 to 46 with a history of 3 consecutive normal Pap tests.  Hepatitis C blood test.** / For any individual with known risks for hepatitis C.  Skin self-exam. / Monthly.  Influenza vaccine. / Every year.  Tetanus, diphtheria, and acellular pertussis (Tdap, Td) vaccine.** / Consult your health care provider. Pregnant women should receive 1 dose of Tdap vaccine during each pregnancy. 1 dose of Td every 10 years.  Varicella vaccine.** / Consult your health care provider. Pregnant females who do not have evidence of immunity should receive the first dose after pregnancy.  HPV vaccine. / 3 doses over 6 months, if 27 and younger. The vaccine is not recommended for use in pregnant females. However, pregnancy testing is not needed before receiving a dose.  Measles, mumps, rubella (MMR) vaccine.** / You need at least 1 dose of MMR if you were born in 1957 or later. You may also need a 2nd dose. For females of childbearing age, rubella immunity should be determined. If there is no evidence of immunity, females who are not pregnant should be vaccinated. If there is no evidence of immunity, females who are pregnant should delay immunization until after pregnancy.  Pneumococcal 13-valent conjugate (PCV13) vaccine.** / Consult your health care provider.  Pneumococcal polysaccharide (PPSV23) vaccine.** / 1 to 2 doses if you smoke cigarettes or if you have certain conditions.  Meningococcal vaccine.** / 1 dose if you are age 96 to 71 years and a Market researcher living in a residence hall, or have one of several  medical conditions, you need to get vaccinated against meningococcal disease. You may also need additional booster doses.  Hepatitis A vaccine.** / Consult your health care provider.  Hepatitis B vaccine.** / Consult your health care provider.  Haemophilus influenzae type b (Hib) vaccine.** / Consult your health care provider. Ages 40 to 74 years  Blood pressure check.** / Every year.  Lipid and cholesterol check.** / Every 5 years beginning at age 61 years.  Lung cancer screening. / Every year if you are aged 29-80 years and have a 30-pack-year history of smoking and currently smoke or have quit within the past 15 years. Yearly screening is stopped once you have quit smoking for at least 15 years or develop a health problem that would prevent you from having lung cancer treatment.  Clinical breast exam.** / Every year after age 76 years.  BRCA-related cancer risk assessment.** / For women who have family members with a BRCA-related cancer (breast, ovarian, tubal, or peritoneal cancers).  Mammogram.** / Every year beginning at age 24 years and continuing for as long as you are in good health. Consult with your health care provider.  Pap test.** / Every 3 years starting at age 56 years through age 56 or 41 years with a history of 3 consecutive normal Pap tests.  HPV screening.** / Every 3 years from ages 35 years through ages 81 to 53 years with a history of 3 consecutive normal Pap tests.  Fecal occult blood test (FOBT) of stool. / Every year beginning at  age 24 years and continuing until age 81 years. You may not need to do this test if you get a colonoscopy every 10 years.  Flexible sigmoidoscopy or colonoscopy.** / Every 5 years for a flexible sigmoidoscopy or every 10 years for a colonoscopy beginning at age 1 years and continuing until age 64 years.  Hepatitis C blood test.** / For all people born from [birth year] through 1963 and any individual with known risks for hepatitis C.  Skin  self-exam. / Monthly.  Influenza vaccine. / Every year.  Tetanus, diphtheria, and acellular pertussis (Tdap/Td) vaccine.** / Consult your health care provider. Pregnant women should receive 1 dose of Tdap vaccine during each pregnancy. 1 dose of Td every 10 years.  Varicella vaccine.** / Consult your health care provider. Pregnant females who do not have evidence of immunity should receive the first dose after pregnancy.  Zoster vaccine.** / 1 dose for adults aged 8 years or older.  Measles, mumps, rubella (MMR) vaccine.** / You need at least 1 dose of MMR if you were born in 1957 or later. You may also need a second dose. For females of childbearing age, rubella immunity should be determined. If there is no evidence of immunity, females who are not pregnant should be vaccinated. If there is no evidence of immunity, females who are pregnant should delay immunization until after pregnancy.  Pneumococcal 13-valent conjugate (PCV13) vaccine.** / Consult your health care provider.  Pneumococcal polysaccharide (PPSV23) vaccine.** / 1 to 2 doses if you smoke cigarettes or if you have certain conditions.  Meningococcal vaccine.** / Consult your health care provider.  Hepatitis A vaccine.** / Consult your health care provider.  Hepatitis B vaccine.** / Consult your health care provider.  Haemophilus influenzae type b (Hib) vaccine.** / Consult your health care provider. Ages 69 years and over  Blood pressure check.** / Every year.  Lipid and cholesterol check.** / Every 5 years beginning at age 50 years.  Lung cancer screening. / Every year if you are aged 36-80 years and have a 30-pack-year history of smoking and currently smoke or have quit within the past 15 years. Yearly screening is stopped once you have quit smoking for at least 15 years or develop a health problem that would prevent you from having lung cancer treatment.  Clinical breast exam.** / Every year after age 28  years.  BRCA-related cancer risk assessment.** / For women who have family members with a BRCA-related cancer (breast, ovarian, tubal, or peritoneal cancers).  Mammogram.** / Every year beginning at age 8 years and continuing for as long as you are in good health. Consult with your health care provider.  Pap test.** / Every 3 years starting at age 54 years through age 37 or 73 years with 3 consecutive normal Pap tests. Testing can be stopped between 65 and 70 years with 3 consecutive normal Pap tests and no abnormal Pap or HPV tests in the past 10 years.  HPV screening.** / Every 3 years from ages 39 years through ages 25 or 65 years with a history of 3 consecutive normal Pap tests. Testing can be stopped between 65 and 70 years with 3 consecutive normal Pap tests and no abnormal Pap or HPV tests in the past 10 years.  Fecal occult blood test (FOBT) of stool. / Every year beginning at age 23 years and continuing until age 3 years. You may not need to do this test if you get a colonoscopy every 10 years.  Flexible sigmoidoscopy or  colonoscopy.** / Every 5 years for a flexible sigmoidoscopy or every 10 years for a colonoscopy beginning at age 66 years and continuing until age 46 years.  Hepatitis C blood test.** / For all people born from [birth year] through 1965 and any individual with known risks for hepatitis C.  Osteoporosis screening.** / A one-time screening for women ages 47 years and over and women at risk for fractures or osteoporosis.  Skin self-exam. / Monthly.  Influenza vaccine. / Every year.  Tetanus, diphtheria, and acellular pertussis (Tdap/Td) vaccine.** / 1 dose of Td every 10 years.  Varicella vaccine.** / Consult your health care provider.  Zoster vaccine.** / 1 dose for adults aged 55 years or older.  Pneumococcal 13-valent conjugate (PCV13) vaccine.** / Consult your health care provider.  Pneumococcal polysaccharide (PPSV23) vaccine.** / 1 dose for all adults aged 22  years and older.  Meningococcal vaccine.** / Consult your health care provider.  Hepatitis A vaccine.** / Consult your health care provider.  Hepatitis B vaccine.** / Consult your health care provider.  Haemophilus influenzae type b (Hib) vaccine.** / Consult your health care provider. ** Family history and personal history of risk and conditions may change your health care provider's recommendations.   This information is not intended to replace advice given to you by your health care provider. Make sure you discuss any questions you have with your health care provider.   Document Released: 10/09/2001 Document Revised: 09/03/2014 Document Reviewed: 01/08/2011 Elsevier Interactive Patient Education [employer].

## 2015-12-22 NOTE — Progress Notes (Signed)
GYNECOLOGY ANNUAL PHYSICAL EXAM PROGRESS NOTE  Subjective:    Tonya Wagner is a 35 y.o. G78P1011 female who presents for an annual exam. The patient has no complaints today. The patient is sexually active.The patient wears seatbelts: yes. The patient participates in regular exercise: no. Has the patient ever been transfused or tattooed?: no. The patient reports that there is not domestic violence in her life.    Gynecologic History Patient's last menstrual period was 11/28/2015. Menarche age: 4 Contraception: Paraguard IUD History of STI's: Denies Last Pap: 03/2014. Results were: normal.  Denies h/o abnormal pap smears.   Obstetric History   G2   P1   T1   P0   A1   TAB0   SAB1   E0   M0   L1     # Outcome Date GA Lbr Len/2nd Weight Sex Delivery Anes PTL Lv  2 Term 05/10/15 [redacted]w[redacted]d 37:34 / 01:16 6 lb 7 oz (2.92 kg) M Vag-Spont EPI  Y     Name: Meriwether,BOY Deshunda     Apgar1:  3                Apgar5: 8  1 SAB         FD      Past Medical History  Diagnosis Date  . Headache   . Vaginal Pap smear, abnormal   . H/O severe pre-eclampsia 04/2015  . Obesity (BMI 35.0-39.9 without comorbidity) ([place])   . Fibroid uterus 05/08/2015    Past Surgical History  Procedure Laterality Date  . Hernia repair    . Gallbladder surgery      Family History  Problem Relation Age of Onset  . Hypertension Mother   . Breast cancer Paternal Aunt   . Ovarian cancer Neg Hx   . Colon cancer Neg Hx     Social History   Social History  . Marital Status: Single    Spouse Name: N/A  . Number of Children: N/A  . Years of Education: N/A   Occupational History  . Not on file.   Social History Main Topics  . Smoking status: Current Every Day Smoker -- 0.25 packs/day  . Smokeless tobacco: Never Used  . Alcohol Use: No  . Drug Use: No  . Sexual Activity: No     Comment: Paragard    Other Topics Concern  . Not on file   Social History Narrative    Current Outpatient  Prescriptions on File Prior to Visit  Medication Sig Dispense Refill  . PARAGARD INTRAUTERINE COPPER IU by Intrauterine route.    . Vitamin D, Ergocalciferol, (DRISDOL) 50000 units CAPS capsule Take 1 capsule (50,000 Units total) by mouth every 7 (seven) days. 30 capsule 1   No current facility-administered medications on file prior to visit.    Allergies  Allergen Reactions  . Benzalkonium Chloride Rash  . Cephalexin Rash  . Neosporin  [Neomycin-Polymyxin-Gramicidin] Rash  . Sulfamethoxazole-Trimethoprim Nausea And Vomiting    Other reaction(s): Unknown      Review of Systems Constitutional: negative for chills, fatigue, fevers and sweats.  Lost 10 lbs, intentional (modifying diet).  Eyes: negative for irritation, redness and visual disturbance Ears, nose, mouth, throat, and face: negative for hearing loss, nasal congestion, snoring and tinnitus Respiratory: negative for asthma, cough, sputum Cardiovascular: negative for chest pain, dyspnea, exertional chest pressure/discomfort, irregular heart beat, palpitations and syncope Gastrointestinal: negative for abdominal pain, change in bowel habits, nausea and vomiting Genitourinary: negative for abnormal  menstrual periods, genital lesions, sexual problems and vaginal discharge, dysuria and urinary incontinence Integument/breast: negative for breast lump, breast tenderness and nipple discharge Hematologic/lymphatic: negative for bleeding and easy bruising Musculoskeletal:negative for back pain and muscle weakness Neurological: negative for dizziness, headaches, vertigo and weakness Endocrine: negative for diabetic symptoms including polydipsia, polyuria and skin dryness Allergic/Immunologic: negative for hay fever and urticaria       Objective:  Blood pressure 95/63, pulse 62, height 5\' 1"  (1.549 m), weight 185 lb 6.4 oz (84.097 kg), last menstrual period 11/28/2015, not currently breastfeeding. Body mass index is 35.05  kg/(m^2).  General Appearance:    Alert, cooperative, no distress, appears stated age. Moderate obesity  Head:    Normocephalic, without obvious abnormality, atraumatic  Eyes:    PERRL, conjunctiva/corneas clear, EOM's intact, both eyes  Ears:    Normal external ear canals, both ears  Nose:   Nares normal, septum midline, mucosa normal, no drainage or sinus tenderness  Throat:   Lips, mucosa, and tongue normal; teeth and gums normal  Neck:   Supple, symmetrical, trachea midline, no adenopathy; thyroid: no enlargement/tenderness/nodules; no carotid bruit or JVD  Back:     Symmetric, no curvature, ROM normal, no CVA tenderness  Lungs:     Clear to auscultation bilaterally, respirations unlabored  Chest Wall:    No tenderness or deformity   Heart:    Regular rate and rhythm, S1 and S2 normal, no murmur, rub or gallop  Breast Exam:    No tenderness, masses, or nipple abnormality  Abdomen:     Soft, non-tender, bowel sounds active all four quadrants, no masses, no organomegaly.    Genitalia:    Pelvic:external genitalia normal, vagina without lesions, discharge, or tenderness, rectovaginal septum  normal. Cervix normal in appearance, no cervical motion tenderness, no adnexal masses or tenderness.  Uterus normal size, shape, mobile, regular contours, nontender.  Rectal:    Normal external sphincter.  No hemorrhoids appreciated. Internal exam not done.   Extremities:   Extremities normal, atraumatic, no cyanosis or edema  Pulses:   2+ and symmetric all extremities  Skin:   Skin color, texture, turgor normal, no rashes or lesions  Lymph nodes:   Cervical, supraclavicular, and axillary nodes normal  Neurologic:   CNII-XII intact, normal strength, sensation and reflexes throughout   .  Labs:  Lab Results  Component Value Date   WBC 10.8 05/11/2015   HGB 9.6* 05/11/2015   HCT 27.6* 05/11/2015   MCV 93.0 05/11/2015   PLT 136* 05/11/2015    Lab Results  Component Value Date   CREATININE 0.98  10/18/2015   BUN 10 10/18/2015   NA 145* 10/18/2015   K 4.4 10/18/2015   CL 103 10/18/2015   CO2 23 10/18/2015    Lab Results  Component Value Date   ALT 66* 05/19/2015   ALT 66* 05/19/2015   AST 27 05/19/2015   AST 22 05/19/2015   ALKPHOS 202* 05/19/2015   ALKPHOS 208* 05/19/2015   BILITOT 0.3 05/19/2015   BILITOT 0.3 05/19/2015    Lab Results  Component Value Date   TSH 1.270 10/18/2015    Lab Results  Component Value Date   HGBA1C 5.9* 10/18/2015     Assessment:   Healthy female exam.   Pre-diabetes Obesity (BMI 35)   Plan:    Breast self exam technique reviewed and patient encouraged to perform self-exam monthly. Discussed healthy lifestyle management. Contraception: Paraguard IUD (placed 06/2015). Pap smear up to date.  Discussed  that patient is up to date, as well as cervical cancer screening guidelines,in addition to noting that insurance company may not pay for pap, however patient still desires pap smear today.  Pap smear performed.  Pre-diabetes - discussed with patient regarding carbohydrate control, healthy diet as well as exercise. Patient notes modifying diet and will work on exercise regimen.  Follow up: in 1 year for annual exam, or as needed.    Rubie Maid, MD [facility]

## 2015-12-25 DIAGNOSIS — R7303 Prediabetes: Secondary | ICD-10-CM | POA: Insufficient documentation

## 2015-12-25 DIAGNOSIS — E669 Obesity, unspecified: Secondary | ICD-10-CM | POA: Insufficient documentation

## 2015-12-26 LAB — PAP IG, CT-NG NAA, HPV HIGH-RISK
Chlamydia, Nuc. Acid Amp: NEGATIVE
Gonococcus by Nucleic Acid Amp: NEGATIVE
HPV, high-risk: NEGATIVE
PAP Smear Comment: 0

## 2015-12-27 ENCOUNTER — Telehealth: Payer: Self-pay

## 2015-12-27 NOTE — Telephone Encounter (Signed)
-----   Message from Rubie Maid, MD sent at 12/27/2015  8:02 AM EDT ----- Please inform of normal pap smear.

## 2015-12-27 NOTE — Telephone Encounter (Signed)
Called pt no answer. LM informing pt of information below.  

## 2016-01-08 NOTE — Progress Notes (Signed)
This encounter was created in error - please disregard.

## 2016-01-11 ENCOUNTER — Telehealth: Payer: Self-pay | Admitting: Obstetrics and Gynecology

## 2016-01-11 NOTE — Telephone Encounter (Signed)
Tonya Wagner would like a return phone call to discuss her most recent PAP smear results.  Pt's ph# [phone] Thank you.

## 2016-01-11 NOTE — Telephone Encounter (Signed)
Called pt she states she wants to know if she was tested for trich in her PAP. Advised pt that she was not tested at that time, and that if she thinks she has been exposed she needs to be tested. Pt requests appt. Pt transferred to reception to be scheduled.

## 2016-01-12 ENCOUNTER — Ambulatory Visit (INDEPENDENT_AMBULATORY_CARE_PROVIDER_SITE_OTHER): Payer: BLUE CROSS/BLUE SHIELD | Admitting: Obstetrics and Gynecology

## 2016-01-12 ENCOUNTER — Encounter: Payer: Self-pay | Admitting: Obstetrics and Gynecology

## 2016-01-12 VITALS — BP 96/62 | HR 64 | Ht 61.0 in | Wt 186.2 lb

## 2016-01-12 DIAGNOSIS — Z202 Contact with and (suspected) exposure to infections with a predominantly sexual mode of transmission: Secondary | ICD-10-CM

## 2016-01-12 DIAGNOSIS — A499 Bacterial infection, unspecified: Secondary | ICD-10-CM

## 2016-01-12 DIAGNOSIS — N760 Acute vaginitis: Secondary | ICD-10-CM

## 2016-01-12 DIAGNOSIS — B9689 Other specified bacterial agents as the cause of diseases classified elsewhere: Secondary | ICD-10-CM

## 2016-01-12 MED ORDER — METRONIDAZOLE 500 MG PO TABS: 500.0000 mg | ORAL_TABLET | Freq: Two times a day (BID) | ORAL | Status: DC

## 2016-01-12 NOTE — Patient Instructions (Signed)
RE: MyChart  Dear Ms. Gaul  We are excited to introduce MyChart, a new best-in-class service that provides you online access to important information in your electronic medical record. We want to make it easier for you to view your health information - all in one secure location - when and where you need it. We expect MyChart will enhance the quality of care and service we provide. Use the activation code below to enroll in MyChart online at [email or web address]  When you register for MyChart, you can:  Marland Kitchen View your test results. . Communicate securely with your physician's office.  . View your medical history, allergies, medications, and immunizations. . Conveniently print information such as your medication lists.  If you are age 33 or older and want a member of your family to have access to your record, you must provide written consent by completing a proxy form available at our facility. Please speak to our clinical staff about guidelines regarding accounts for patients younger than age 33.  As you activate your MyChart account and need any technical assistance, please call the MyChart technical support line at [phone]) or email your question to [email or web address]. If you email your question(s), please include your name, a return phone number and the best time to reach you.  Thank you for using MyChart as your new health and wellness resource!  MyChart Activation Code:  [email or web address] Expires: 02/20/2016  9:49 AM           [facility]  [place], [place] [address]   Bacterial Vaginosis Bacterial vaginosis is a vaginal infection that occurs when the normal balance of bacteria in the vagina is disrupted. It results from an overgrowth of certain bacteria. This is the most common vaginal infection in women of childbearing age. Treatment is important to prevent complications, especially in pregnant women, as it can cause a  premature delivery. CAUSES  Bacterial vaginosis is caused by an increase in harmful bacteria that are normally present in smaller amounts in the vagina. Several different kinds of bacteria can cause bacterial vaginosis. However, the reason that the condition develops is not fully understood. RISK FACTORS Certain activities or behaviors can put you at an increased risk of developing bacterial vaginosis, including:  Having a new sex partner or multiple sex partners.  Douching.  Using an intrauterine device (IUD) for contraception. Women do not get bacterial vaginosis from toilet seats, bedding, swimming pools, or contact with objects around them. SIGNS AND SYMPTOMS  Some women with bacterial vaginosis have no signs or symptoms. Common symptoms include:  Grey vaginal discharge.  A fishlike odor with discharge, especially after sexual intercourse.  Itching or burning of the vagina and vulva.  Burning or pain with urination. DIAGNOSIS  Your health care provider will take a medical history and examine the vagina for signs of bacterial vaginosis. A sample of vaginal fluid may be taken. Your health care provider will look at this sample under a microscope to check for bacteria and abnormal cells. A vaginal pH test may also be done.  TREATMENT  Bacterial vaginosis may be treated with antibiotic medicines. These may be given in the form of a pill or a vaginal cream. A second round of antibiotics may be prescribed if the condition comes back after treatment. Because bacterial vaginosis increases your risk for sexually transmitted diseases, getting treated can help reduce your risk for chlamydia, gonorrhea, HIV, and herpes. HOME CARE INSTRUCTIONS   Only take over-the-counter or prescription  medicines as directed by your health care provider.  If antibiotic medicine was prescribed, take it as directed. Make sure you finish it even if you start to feel better.  Tell all sexual partners that you  have a vaginal infection. They should see their health care provider and be treated if they have problems, such as a mild rash or itching.  During treatment, it is important that you follow these instructions:  Avoid sexual activity or use condoms correctly.  Do not douche.  Avoid alcohol as directed by your health care provider.  Avoid breastfeeding as directed by your health care provider. SEEK MEDICAL CARE IF:   Your symptoms are not improving after 3 days of treatment.  You have increased discharge or pain.  You have a fever. MAKE SURE YOU:   Understand these instructions.  Will watch your condition.  Will get help right away if you are not doing well or get worse. FOR MORE INFORMATION  Centers for Disease Control and Prevention, Division of STD Prevention: [employer] American Sexual Health Association (ASHA): www.ashastd.org    This information is not intended to replace advice given to you by your health care provider. Make sure you discuss any questions you have with your health care provider.   Document Released: 08/13/2005 Document Revised: 09/03/2014 Document Reviewed: 03/25/2013 Elsevier Interactive Patient Education [employer].

## 2016-01-12 NOTE — Progress Notes (Signed)
     GYNECOLOGY PROGRESS NOTE  Subjective:    Patient ID: Tonya Wagner, female    DOB: 1981-08-06, 35 y.o.   MRN: [id number]  HPI  Patient is a 35 y.o. G36P1011 female who presents for sexually transmitted disease check. Sexual history reviewed with the patient. STD exposure: contact with individual with STD of unknown type (possibly Trichomoniasis, as patient states she received a phone call from another woman who had been exposed to trichomoniasis, as they currently share same partner)  3 weeks ago. Previous history of STD:  none. Current symptoms include none.  Contraception: Paraguard IUD.  The following portions of the patient's history were reviewed and updated as appropriate: allergies, current medications, past family history, past medical history, past social history, past surgical history and problem list.  Review of Systems Pertinent items noted in HPI and remainder of comprehensive ROS otherwise negative.   Objective:   Blood pressure 96/62, pulse 64, height 5\' 1"  (1.549 m), weight 186 lb 3.2 oz (84.46 kg), last menstrual period 12/28/2015, not currently breastfeeding. General appearance: alert and no distress Abdomen: soft, non-tender; bowel sounds normal; no masses,  no organomegaly Pelvis: external genitalia normal, rectovaginal septum normal.  Vagina with small amount of white thin discharge.  Cervix normal appearing, no lesions and no motion tenderness.  Uterus mobile, nontender, normal shape and size.  Adnexae non-palpable, nontender bilaterally.    Microscopic wet-mount exam shows clue cells.  No hyphae, WBC's,  trichomonads noted.  KOH done.  Positive whiff test .  Assessment:   STD exposure Bacterial vaginosis  Plan:   Discussed safe sex practices, advised on partner testing and treatment if warranted.  To refrain from sexual activity with partner if positive for STD for at least 7 days post-treatment.  Discussed diagnosis of bacterial vaginosis. Will  treat with Flagyl PO x 7 days.  Will also send Nuswab as patient desires to be tested for vaginal STDs.    Rubie Maid, MD [facility]

## 2016-01-16 ENCOUNTER — Telehealth: Payer: Self-pay

## 2016-01-16 LAB — NUSWAB VAGINITIS PLUS (VG+)
Atopobium vaginae: HIGH Score — AB
BVAB 2: HIGH Score — AB
Candida albicans, NAA: NEGATIVE
Candida glabrata, NAA: NEGATIVE
Chlamydia trachomatis, NAA: NEGATIVE
Megasphaera 1: HIGH Score — AB
Neisseria gonorrhoeae, NAA: NEGATIVE
Trich vag by NAA: POSITIVE — AB

## 2016-01-16 NOTE — Telephone Encounter (Signed)
Called pt no answer, LM for pt to call back  

## 2016-01-16 NOTE — Telephone Encounter (Signed)
Pt calls back, informed her of the dx below, pt states that she has been complaint with medication. Pt also notes that partner was treated by health dept. Advised pt to avoid sex for at least a week. Return in 1 month for TOC.

## 2016-01-16 NOTE — Telephone Encounter (Signed)
-----   Message from Rubie Maid, MD sent at 01/16/2016  8:52 AM EDT ----- Please inform patient that her screen was positive for BV as well as for trichomoniasis.  She is currently already taking the Flagyl as prescribe.  Needs to avoid sexual activity for at least 1 week until after both partners have been treated.

## 2016-11-20 ENCOUNTER — Other Ambulatory Visit: Payer: Self-pay | Admitting: Obstetrics and Gynecology

## 2016-11-20 DIAGNOSIS — E559 Vitamin D deficiency, unspecified: Secondary | ICD-10-CM

## 2016-12-25 ENCOUNTER — Encounter: Payer: BLUE CROSS/BLUE SHIELD | Admitting: Obstetrics and Gynecology

## 2017-01-04 ENCOUNTER — Ambulatory Visit (INDEPENDENT_AMBULATORY_CARE_PROVIDER_SITE_OTHER): Payer: BLUE CROSS/BLUE SHIELD | Admitting: Obstetrics and Gynecology

## 2017-01-04 ENCOUNTER — Encounter: Payer: Self-pay | Admitting: Obstetrics and Gynecology

## 2017-01-04 VITALS — BP 107/74 | HR 67 | Ht 61.0 in | Wt 183.5 lb

## 2017-01-04 DIAGNOSIS — Z01419 Encounter for gynecological examination (general) (routine) without abnormal findings: Secondary | ICD-10-CM

## 2017-01-04 DIAGNOSIS — E669 Obesity, unspecified: Secondary | ICD-10-CM | POA: Diagnosis not present

## 2017-01-04 DIAGNOSIS — Z113 Encounter for screening for infections with a predominantly sexual mode of transmission: Secondary | ICD-10-CM | POA: Diagnosis not present

## 2017-01-04 DIAGNOSIS — E66811 Obesity, class 1: Secondary | ICD-10-CM

## 2017-01-04 DIAGNOSIS — Z8639 Personal history of other endocrine, nutritional and metabolic disease: Secondary | ICD-10-CM

## 2017-01-04 NOTE — Patient Instructions (Addendum)
Health Maintenance, Female Adopting a healthy lifestyle and getting preventive care can go a long way to promote health and wellness. Talk with your health care provider about what schedule of regular examinations is right for you. This is a good chance for you to check in with your provider about disease prevention and staying healthy. In between checkups, there are plenty of things you can do on your own. Experts have done a lot of research about which lifestyle changes and preventive measures are most likely to keep you healthy. Ask your health care provider for more information. Weight and diet Eat a healthy diet  Be sure to include plenty of vegetables, fruits, low-fat dairy products, and lean protein.  Do not eat a lot of foods high in solid fats, added sugars, or salt.  Get regular exercise. This is one of the most important things you can do for your health.  Most adults should exercise for at least 150 minutes each week. The exercise should increase your heart rate and make you sweat (moderate-intensity exercise).  Most adults should also do strengthening exercises at least twice a week. This is in addition to the moderate-intensity exercise. Maintain a healthy weight  Body mass index (BMI) is a measurement that can be used to identify possible weight problems. It estimates body fat based on height and weight. Your health care provider can help determine your BMI and help you achieve or maintain a healthy weight.  For females 76 years of age and older:  A BMI below 18.5 is considered underweight.  A BMI of 18.5 to 24.9 is normal.  A BMI of 25 to 29.9 is considered overweight.  A BMI of 30 and above is considered obese. Watch levels of cholesterol and blood lipids  You should start having your blood tested for lipids and cholesterol at 34 years of age, then have this test every 5 years.  You may need to have your cholesterol levels checked more often if:  Your lipid or  cholesterol levels are high.  You are older than 34 years of age.  You are at high risk for heart disease. Cancer screening Lung Cancer  Lung cancer screening is recommended for adults 64-42 years old who are at high risk for lung cancer because of a history of smoking.  A yearly low-dose CT scan of the lungs is recommended for people who:  Currently smoke.  Have quit within the past 15 years.  Have at least a 30-pack-year history of smoking. A pack year is smoking an average of one pack of cigarettes a day for 1 year.  Yearly screening should continue until it has been 15 years since you quit.  Yearly screening should stop if you develop a health problem that would prevent you from having lung cancer treatment. Breast Cancer  Practice breast self-awareness. This means understanding how your breasts normally appear and feel.  It also means doing regular breast self-exams. Let your health care provider know about any changes, no matter how small.  If you are in your 20s or 30s, you should have a clinical breast exam (CBE) by a health care provider every 1-3 years as part of a regular health exam.  If you are 34 or older, have a CBE every year. Also consider having a breast X-ray (mammogram) every year.  If you have a family history of breast cancer, talk to your health care provider about genetic screening.  If you are at high risk for breast cancer, talk  to your health care provider about having an MRI and a mammogram every year.  Breast cancer gene (BRCA) assessment is recommended for women who have family members with BRCA-related cancers. BRCA-related cancers include:  Breast.  Ovarian.  Tubal.  Peritoneal cancers.  Results of the assessment will determine the need for genetic counseling and BRCA1 and BRCA2 testing. Cervical Cancer  Your health care provider may recommend that you be screened regularly for cancer of the pelvic organs (ovaries, uterus, and vagina).  This screening involves a pelvic examination, including checking for microscopic changes to the surface of your cervix (Pap test). You may be encouraged to have this screening done every 3 years, beginning at age 24.  For women ages 66-65, health care providers may recommend pelvic exams and Pap testing every 3 years, or they may recommend the Pap and pelvic exam, combined with testing for human papilloma virus (HPV), every 5 years. Some types of HPV increase your risk of cervical cancer. Testing for HPV may also be done on women of any age with unclear Pap test results.  Other health care providers may not recommend any screening for nonpregnant women who are considered low risk for pelvic cancer and who do not have symptoms. Ask your health care provider if a screening pelvic exam is right for you.  If you have had past treatment for cervical cancer or a condition that could lead to cancer, you need Pap tests and screening for cancer for at least 20 years after your treatment. If Pap tests have been discontinued, your risk factors (such as having a new sexual partner) need to be reassessed to determine if screening should resume. Some women have medical problems that increase the chance of getting cervical cancer. In these cases, your health care provider may recommend more frequent screening and Pap tests. Colorectal Cancer  This type of cancer can be detected and often prevented.  Routine colorectal cancer screening usually begins at 34 years of age and continues through 34 years of age.  Your health care provider may recommend screening at an earlier age if you have risk factors for colon cancer.  Your health care provider may also recommend using home test kits to check for hidden blood in the stool.  A small camera at the end of a tube can be used to examine your colon directly (sigmoidoscopy or colonoscopy). This is done to check for the earliest forms of colorectal cancer.  Routine  screening usually begins at age 41.  Direct examination of the colon should be repeated every 5-10 years through 35 years of age. However, you may need to be screened more often if early forms of precancerous polyps or small growths are found. Skin Cancer  Check your skin from head to toe regularly.  Tell your health care provider about any new moles or changes in moles, especially if there is a change in a mole's shape or color.  Also tell your health care provider if you have a mole that is larger than the size of a pencil eraser.  Always use sunscreen. Apply sunscreen liberally and repeatedly throughout the day.  Protect yourself by wearing long sleeves, pants, a wide-brimmed hat, and sunglasses whenever you are outside. Heart disease, diabetes, and high blood pressure  High blood pressure causes heart disease and increases the risk of stroke. High blood pressure is more likely to develop in:  People who have blood pressure in the high end of the normal range (130-139/85-89 mm Hg).  People who are overweight or obese.  People who are African American.  If you are 59-24 years of age, have your blood pressure checked every 3-5 years. If you are 34 years of age or older, have your blood pressure checked every year. You should have your blood pressure measured twice-once when you are at a hospital or clinic, and once when you are not at a hospital or clinic. Record the average of the two measurements. To check your blood pressure when you are not at a hospital or clinic, you can use:  An automated blood pressure machine at a pharmacy.  A home blood pressure monitor.  If you are between 29 years and 60 years old, ask your health care provider if you should take aspirin to prevent strokes.  Have regular diabetes screenings. This involves taking a blood sample to check your fasting blood sugar level.  If you are at a normal weight and have a low risk for diabetes, have this test once  every three years after 34 years of age.  If you are overweight and have a high risk for diabetes, consider being tested at a younger age or more often. Preventing infection Hepatitis B  If you have a higher risk for hepatitis B, you should be screened for this virus. You are considered at high risk for hepatitis B if:  You were born in a country where hepatitis B is common. Ask your health care provider which countries are considered high risk.  Your parents were born in a high-risk country, and you have not been immunized against hepatitis B (hepatitis B vaccine).  You have HIV or AIDS.  You use needles to inject street drugs.  You live with someone who has hepatitis B.  You have had sex with someone who has hepatitis B.  You get hemodialysis treatment.  You take certain medicines for conditions, including cancer, organ transplantation, and autoimmune conditions. Hepatitis C  Blood testing is recommended for:  Everyone born from [birth year] through 1963.  Anyone with known risk factors for hepatitis C. Sexually transmitted infections (STIs)  You should be screened for sexually transmitted infections (STIs) including gonorrhea and chlamydia if:  You are sexually active and are younger than 34 years of age.  You are older than 34 years of age and your health care provider tells you that you are at risk for this type of infection.  Your sexual activity has changed since you were last screened and you are at an increased risk for chlamydia or gonorrhea. Ask your health care provider if you are at risk.  If you do not have HIV, but are at risk, it may be recommended that you take a prescription medicine daily to prevent HIV infection. This is called pre-exposure prophylaxis (PrEP). You are considered at risk if:  You are sexually active and do not regularly use condoms or know the HIV status of your partner(s).  You take drugs by injection.  You are sexually active with a partner  who has HIV. Talk with your health care provider about whether you are at high risk of being infected with HIV. If you choose to begin PrEP, you should first be tested for HIV. You should then be tested every 3 months for as long as you are taking PrEP. Pregnancy  If you are premenopausal and you may become pregnant, ask your health care provider about preconception counseling.  If you may become pregnant, take 400 to 800 micrograms (mcg) of folic acid  every day.  If you want to prevent pregnancy, talk to your health care provider about birth control (contraception). Osteoporosis and menopause  Osteoporosis is a disease in which the bones lose minerals and strength with aging. This can result in serious bone fractures. Your risk for osteoporosis can be identified using a bone density scan.  If you are 4 years of age or older, or if you are at risk for osteoporosis and fractures, ask your health care provider if you should be screened.  Ask your health care provider whether you should take a calcium or vitamin D supplement to lower your risk for osteoporosis.  Menopause may have certain physical symptoms and risks.  Hormone replacement therapy may reduce some of these symptoms and risks. Talk to your health care provider about whether hormone replacement therapy is right for you. Follow these instructions at home:  Schedule regular health, dental, and eye exams.  Stay current with your immunizations.  Do not use any tobacco products including cigarettes, chewing tobacco, or electronic cigarettes.  If you are pregnant, do not drink alcohol.  If you are breastfeeding, limit how much and how often you drink alcohol.  Limit alcohol intake to no more than 1 drink per day for nonpregnant women. One drink equals 12 ounces of beer, 5 ounces of wine, or 1 ounces of hard liquor.  Do not use street drugs.  Do not share needles.  Ask your health care provider for help if you need support  or information about quitting drugs.  Tell your health care provider if you often feel depressed.  Tell your health care provider if you have ever been abused or do not feel safe at home. This information is not intended to replace advice given to you by your health care provider. Make sure you discuss any questions you have with your health care provider. Document Released: 02/26/2011 Document Revised: 01/19/2016 Document Reviewed: 05/17/2015 Elsevier Interactive Patient Education  2017 [employer].

## 2017-01-04 NOTE — Progress Notes (Signed)
GYNECOLOGY ANNUAL PHYSICAL EXAM PROGRESS NOTE  Subjective:    Tonya Wagner is a 36 y.o. G59P1011 female who presents for an annual exam. The patient has no complaints today. The patient is sexually active.  The patient wears seatbelts: yes. The patient participates in regular exercise: no. Has the patient ever been transfused or tattooed?: yes (tattoos). The patient reports that there is not domestic violence in her life.    Gynecologic History Menarche age: 17 Patient's last menstrual period was 12/25/2016. Period Cycle (Days): 28 Period Duration (Days): 5 Period Pattern: Regular Menstrual Flow: Moderate Dysmenorrhea: None  Contraception: ParaGard IUD History of STI's: Denies  Last Pap: 12/22/2015. Results were: normal.  Denies h/o abnormal pap smears.   Obstetric History   G2   P1   T1   P0   A1   L1    SAB1   TAB0   Ectopic0   Multiple0   Live Births1     # Outcome Date GA Lbr Len/2nd Weight Sex Delivery Anes PTL Lv  2 Term 05/10/15 [redacted]w[redacted]d 37:34 / 01:16 6 lb 7 oz (2.92 kg) M Vag-Spont EPI  LIV     Name: Mcmillon,BOY [place]     Apgar1:  3                Apgar5: 8  1 SAB         FD      Past Medical History:  Diagnosis Date  . Fibroid uterus 05/08/2015  . H/O severe pre-eclampsia 04/2015  . Headache   . Obesity (BMI 35.0-39.9 without comorbidity)   . Vaginal Pap smear, abnormal     Past Surgical History:  Procedure Laterality Date  . GALLBLADDER SURGERY    . HERNIA REPAIR      Family History  Problem Relation Age of Onset  . Hypertension Mother   . Breast cancer Paternal Aunt   . Ovarian cancer Neg Hx   . Colon cancer Neg Hx   . Osteoarthritis Neg Hx   . Diabetes Neg Hx   . Heart failure Neg Hx     Social History   Social History  . Marital status: Single    Spouse name: N/A  . Number of children: N/A  . Years of education: N/A   Occupational History  . Not on file.   Social History Main Topics  . Smoking status: Former Smoker   Packs/day: 0.25    Quit date: 01/01/2017  . Smokeless tobacco: Never Used  . Alcohol use No  . Drug use: No  . Sexual activity: Yes    Birth control/ protection: IUD     Comment: Paragard    Other Topics Concern  . Not on file   Social History Narrative  . No narrative on file    Current Outpatient Prescriptions on File Prior to Visit  Medication Sig Dispense Refill  . PARAGARD INTRAUTERINE COPPER IU by Intrauterine route.     No current facility-administered medications on file prior to visit.     Allergies  Allergen Reactions  . Benzalkonium Chloride Rash  . Cephalexin Rash  . Neosporin  [Neomycin-Polymyxin-Gramicidin] Rash  . Sulfamethoxazole-Trimethoprim Nausea And Vomiting    Other reaction(s): Unknown     Review of Systems Constitutional: negative for chills, fatigue, fevers and sweats Eyes: negative for irritation, redness and visual disturbance Ears, nose, mouth, throat, and face: negative for hearing loss, nasal congestion, snoring and tinnitus Respiratory: negative for asthma, cough, sputum Cardiovascular: negative for  chest pain, dyspnea, exertional chest pressure/discomfort, irregular heart beat, palpitations and syncope Gastrointestinal: negative for abdominal pain, change in bowel habits, nausea and vomiting Genitourinary: negative for abnormal menstrual periods, genital lesions, sexual problems and vaginal discharge, dysuria and urinary incontinence Integument/breast: negative for breast lump, breast tenderness and nipple discharge Hematologic/lymphatic: negative for bleeding and easy bruising Musculoskeletal:negative for back pain and muscle weakness Neurological: negative for dizziness, headaches, vertigo and weakness Endocrine: negative for diabetic symptoms including polydipsia, polyuria and skin dryness Allergic/Immunologic: negative for hay fever and urticaria       Objective:  Blood pressure 107/74, pulse 67, height 5\' 1"  (1.549 m), weight 183 lb  8 oz (83.2 kg), last menstrual period 12/25/2016, not currently breastfeeding. Body mass index is 34.67 kg/m.   General Appearance:    Alert, cooperative, no distress, appears stated age, mildly obese  Head:    Normocephalic, without obvious abnormality, atraumatic  Eyes:    PERRL, conjunctiva/corneas clear, EOM's intact, both eyes  Ears:    Normal external ear canals, both ears  Nose:   Nares normal, septum midline, mucosa normal, no drainage or sinus tenderness  Throat:   Lips, mucosa, and tongue normal; teeth and gums normal  Neck:   Supple, symmetrical, trachea midline, no adenopathy; thyroid: no enlargement/tenderness/nodules; no carotid bruit or JVD  Back:     Symmetric, no curvature, ROM normal, no CVA tenderness  Lungs:     Clear to auscultation bilaterally, respirations unlabored  Chest Wall:    No tenderness or deformity   Heart:    Regular rate and rhythm, S1 and S2 normal, no murmur, rub or gallop  Breast Exam:    No tenderness, masses, or nipple abnormality  Abdomen:     Soft, non-tender, bowel sounds active all four quadrants, no masses, no organomegaly.    Genitalia:    Pelvic:external genitalia normal, vagina without lesions, discharge, or tenderness, rectovaginal septum  normal. Cervix normal in appearance, no cervical motion tenderness, no adnexal masses or tenderness.  Uterus normal size, shape, mobile, regular contours, nontender.  Rectal:    Normal external sphincter.  No hemorrhoids appreciated. Internal exam not done.   Extremities:   Extremities normal, atraumatic, no cyanosis or edema  Pulses:   2+ and symmetric all extremities  Skin:   Skin color, texture, turgor normal, no rashes or lesions  Lymph nodes:   Cervical, supraclavicular, and axillary nodes normal  Neurologic:   CNII-XII intact, normal strength, sensation and reflexes throughout   .  Labs:  Lab Results  Component Value Date   WBC 10.8 05/11/2015   HGB 9.6 (L) 05/11/2015   HCT 27.6 (L) 05/11/2015     MCV 93.0 05/11/2015   PLT 136 (L) 05/11/2015    Lab Results  Component Value Date   CREATININE 0.98 10/18/2015   BUN 10 10/18/2015   NA 145 (H) 10/18/2015   K 4.4 10/18/2015   CL 103 10/18/2015   CO2 23 10/18/2015    Lab Results  Component Value Date   ALT 66 (H) 05/19/2015   ALT 66 (H) 05/19/2015   AST 27 05/19/2015   AST 22 05/19/2015   ALKPHOS 202 (H) 05/19/2015   ALKPHOS 208 (H) 05/19/2015   BILITOT 0.3 05/19/2015   BILITOT 0.3 05/19/2015    Lab Results  Component Value Date   TSH 1.270 10/18/2015   Lab Results  Component Value Date   HGBA1C 5.9 (H) 10/18/2015    Labs performed by job October 26, 2016 - LDL  69   HDL 67   Choleserol 172 TG 184  Glucose 83  HgbA1c 5  Assessment:    Healthy female exam.   Mild obesity STD screening   Plan:     Blood tests: Comprehensive metabolic panel and Vitamin D level. Breast self exam technique reviewed and patient encouraged to perform self-exam monthly. 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 Discussed healthy lifestyle modifications. Pap smear up to date.   Notes having HgbA1c repeated at job and is no longer "pre-diabetic". Continue dietary modifications and encouraged exercise. Patient desires routine STD screening.  Counseled on safe sex practices.   Follow up in 1 year for annual exam.     Rubie Maid, MD [facility]

## 2017-01-05 LAB — COMPREHENSIVE METABOLIC PANEL
ALT: 68 IU/L — ABNORMAL HIGH (ref 0–32)
AST: 52 IU/L — ABNORMAL HIGH (ref 0–40)
Albumin/Globulin Ratio: 1.4 (ref 1.2–2.2)
Albumin: 4.2 g/dL (ref 3.5–5.5)
Alkaline Phosphatase: 193 IU/L — ABNORMAL HIGH (ref 39–117)
BUN/Creatinine Ratio: 10 (ref 9–23)
BUN: 8 mg/dL (ref 6–20)
Bilirubin Total: 0.5 mg/dL (ref 0.0–1.2)
CO2: 23 mmol/L (ref 18–29)
Calcium: 9.3 mg/dL (ref 8.7–10.2)
Chloride: 100 mmol/L (ref 96–106)
Creatinine, Ser: 0.82 mg/dL (ref 0.57–1.00)
GFR calc Af Amer: 108 mL/min/{1.73_m2} (ref >59)
GFR calc non Af Amer: 94 mL/min/{1.73_m2} (ref >59)
Globulin, Total: 3 g/dL (ref 1.5–4.5)
Glucose: 81 mg/dL (ref 65–99)
Potassium: 4.4 mmol/L (ref 3.5–5.2)
Sodium: 140 mmol/L (ref 134–144)
Total Protein: 7.2 g/dL (ref 6.0–8.5)

## 2017-01-05 LAB — VITAMIN D 25 HYDROXY (VIT D DEFICIENCY, FRACTURES): Vit D, 25-Hydroxy: 33.5 ng/mL (ref 30.0–100.0)

## 2017-01-05 LAB — HIV ANTIBODY (ROUTINE TESTING W REFLEX): HIV Screen 4th Generation wRfx: NONREACTIVE

## 2017-01-05 LAB — RPR: RPR Ser Ql: NONREACTIVE

## 2017-01-07 ENCOUNTER — Other Ambulatory Visit: Payer: Self-pay | Admitting: Obstetrics and Gynecology

## 2017-01-07 DIAGNOSIS — R748 Abnormal levels of other serum enzymes: Secondary | ICD-10-CM

## 2017-01-09 ENCOUNTER — Telehealth: Payer: Self-pay

## 2017-01-09 DIAGNOSIS — N760 Acute vaginitis: Secondary | ICD-10-CM

## 2017-01-09 DIAGNOSIS — B9689 Other specified bacterial agents as the cause of diseases classified elsewhere: Secondary | ICD-10-CM

## 2017-01-09 LAB — NUSWAB VAGINITIS PLUS (VG+)
Atopobium vaginae: HIGH Score — AB
BVAB 2: HIGH Score — AB
Candida albicans, NAA: NEGATIVE
Candida glabrata, NAA: NEGATIVE
Chlamydia trachomatis, NAA: NEGATIVE
Megasphaera 1: HIGH Score — AB
Neisseria gonorrhoeae, NAA: NEGATIVE
Trich vag by NAA: NEGATIVE

## 2017-01-09 MED ORDER — METRONIDAZOLE 0.75 % VA GEL: 1.0000 | Freq: Two times a day (BID) | VAGINAL | 0 refills | Status: DC

## 2017-01-09 NOTE — Telephone Encounter (Signed)
-----   Message from Rubie Maid, MD sent at 01/09/2017  8:20 AM EDT ----- Please inform patient of negative STD screen, but is positive for BV.  Can be treated with Flagyl PO or vaginally.

## 2017-01-09 NOTE — Telephone Encounter (Signed)
Called pt informed her of +BV and the need for further blood work (order placed by Dr.Cherry). Pt gave verbal understanding. Pt schedule for lab 01/10/17 @11 :30am. RX sent for metrogel. Sent text for mychart sign up.

## 2017-01-10 ENCOUNTER — Other Ambulatory Visit: Payer: BLUE CROSS/BLUE SHIELD

## 2017-01-10 ENCOUNTER — Telehealth: Payer: Self-pay | Admitting: Obstetrics and Gynecology

## 2017-01-10 DIAGNOSIS — N760 Acute vaginitis: Secondary | ICD-10-CM

## 2017-01-10 DIAGNOSIS — R748 Abnormal levels of other serum enzymes: Secondary | ICD-10-CM

## 2017-01-10 DIAGNOSIS — B9689 Other specified bacterial agents as the cause of diseases classified elsewhere: Secondary | ICD-10-CM

## 2017-01-10 MED ORDER — CLINDESSE 2 % VA CREA: TOPICAL_CREAM | VAGINAL | 0 refills | Status: DC

## 2017-01-10 NOTE — Telephone Encounter (Signed)
Patient has a question about the script she got yesterday  Please call

## 2017-01-10 NOTE — Telephone Encounter (Signed)
Spoke with pt (came in for labs) she states that metrogel is greater than $100 requests something else. Sent in RX for Clindesse, gave pt $25 coupon card. Advised pt to call back if she has any issues with cost.

## 2017-01-11 LAB — IRON AND TIBC
Iron Saturation: 26 % (ref 15–55)
Iron: 83 ug/dL (ref 27–159)
Total Iron Binding Capacity: 319 ug/dL (ref 250–450)
UIBC: 236 ug/dL (ref 131–425)

## 2017-01-11 LAB — HEPATITIS PANEL, ACUTE
Hep A IgM: NEGATIVE
Hep B C IgM: NEGATIVE
Hep C Virus Ab: <0.1 s/co ratio (ref 0.0–0.9)
Hepatitis B Surface Ag: NEGATIVE

## 2017-07-12 ENCOUNTER — Encounter: Payer: Self-pay | Admitting: Obstetrics and Gynecology

## 2017-07-12 ENCOUNTER — Ambulatory Visit (INDEPENDENT_AMBULATORY_CARE_PROVIDER_SITE_OTHER): Payer: BLUE CROSS/BLUE SHIELD | Admitting: Obstetrics and Gynecology

## 2017-07-12 VITALS — BP 105/65 | HR 63 | Ht 61.0 in | Wt 180.5 lb

## 2017-07-12 DIAGNOSIS — D259 Leiomyoma of uterus, unspecified: Secondary | ICD-10-CM | POA: Diagnosis not present

## 2017-07-12 DIAGNOSIS — N814 Uterovaginal prolapse, unspecified: Secondary | ICD-10-CM

## 2017-07-12 NOTE — Progress Notes (Signed)
    GYNECOLOGY PROGRESS NOTE  Subjective:    Patient ID: Tonya Wagner, female    DOB: 1980/10/26, 36 y.o.   MRN: [id number]  HPI  Patient is a 36 y.o. G56P1011 female who presents for complaints of feeling a bulge in the vaginal area.  Notes that several weeks ago she felt a bulge in her vagina after attempting to insert a tampon during her menses.  States that she also noted it again approximately 1 week ago when she was performing an IUD string check. Of note, patient does have a h/o large uterine fibroids (~ 8-9 cm), last visualized during ultrasound 2 years ago during third trimester of her pregnancy.    The following portions of the patient's history were reviewed and updated as appropriate: allergies, current medications, past family history, past medical history, past social history, past surgical history and problem list.  Review of Systems Pertinent items noted in HPI and remainder of comprehensive ROS otherwise negative.   Objective:   Blood pressure 105/65, pulse 63, height 5\' 1"  (1.549 m), weight 180 lb 8 oz (81.9 kg), last menstrual period 06/25/2017. General appearance: alert and no distress Abdomen: normal findings: bowel sounds normal and soft, non-tender and abnormal findings:  mass, located in the lower abdomen arising from the pelvis Pelvic: external genitalia normal, rectovaginal septum normal.  Vagina without discharge.  Cervix normal appearing, no lesions and no motion tenderness.  Uterus mobile, nontender, normal shape, but enlarged, ~ 14-16 week size. Grade 2-3 uterine prolapse present (with cervix noted to descent to introitus with valsalva.  No cystocele or rectocele.  Adnexae non-palpable, nontender bilaterally.  Extremities: extremities normal, atraumatic, no cyanosis or edema Neurologic: Grossly normal   Assessment:   Uterine prolapse (Grade 2-3) Uterine fibroids  Plan:   - Discussed uterine prolapse and management options with the patient, including  pelvic floor physical therapy with/without pessary use, or surgical management with myomectomy (as large fibroids are likely a potential factor in her prolapse).  Patient desires to think over options.  - Will get ultrasound to reassess size, location of current uterine fibroids, as it has been at least 2 years since last ultrasound.   Will call with results.  All questions answered.    Rubie Maid, MD [facility]

## 2017-07-12 NOTE — Patient Instructions (Signed)
Myomectomy Myomectomy is surgery to remove a noncancerous tumor (myoma) from the uterus. Myomas are tumors made up of fibrous tissue. They are often called fibroid tumors. Fibroid tumors can range from the size of a pea to the size of a grapefruit. In a myomectomy, the fibroid tumor is removed without removing the uterus. Because these tumors are rarely cancerous, this surgery is usually done only if the tumor is growing or causing symptoms such as pain, pressure, bleeding, or pain with intercourse. LET YOUR HEALTH CARE PROVIDER KNOW ABOUT:  Any allergies you have.  All medicines you are taking, including vitamins, herbs, eye drops, creams, and over-the-counter medicines.  Previous problems you or members of your family have had with the use of anesthetics.  Any blood disorders you have.  Previous surgeries you have had.  Medical conditions you have. RISKS AND COMPLICATIONS Generally, this is a safe procedure. However, as with any procedure, complications can occur. Possible complications include:  Excessive bleeding.  Infection.  Injury to nearby organs.  Blood clots in the legs, chest, or brain.  Scar tissue on other organs and in the pelvis. This may require another surgery to remove the scar tissue.  BEFORE THE PROCEDURE  Ask your health care provider about changing or stopping your regular medicines. Avoid taking aspirin or blood thinners as directed by your health care provider.  Do not  eat or drink anything after midnight on the night before surgery.  If you smoke, do not  smoke for 2 weeks before the surgery.  Do not  drink alcohol the day before the surgery.  Arrange for someone to drive you home after the procedure or after your hospital stay. Also arrange for someone to help you with activities during your recovery. PROCEDURE You will be given medicine to make you sleep through the procedure (general anesthetic). Any of the following methods may be used to perform  a myomectomy:  Small monitors will be put on your body. They are used to check your heart, blood pressure, and oxygen level.  An IV access tube will be put into one of your veins. Medicine will be able to flow directly into your body through this IV tube.  You might be given a medicine to help you relax (sedative).  You will be given a medicine to make you sleep (general anesthetic). A breathing tube will be placed into your lungs during the procedure.  A thin, flexible tube (catheter) will be inserted into your bladder to collect urine.  Any of the following methods may be used to perform a myomectomy: ? Hysteroscopic myomectomy-This method may be used when the fibroid tumor is inside the cavity of the uterus. A long, thin tube that is like a telescope (hysteroscope) is inserted inside the uterus. A saline solution is put into your uterus. This expands the uterus and allows the surgeon to see the fibroids. Tools are passed through the hysteroscope to remove the fibroid tumor in pieces. ? Laparoscopic myomectomy-A few small cuts (incisions) are made in the lower abdomen. A thin, lighted tube with a tiny camera on the end (laparoscope) is inserted through one of the incisions. This gives the surgeon a good view of the area. The fibroid tumor is removed through the other incisions. The incisions are then closed with stitches (sutures) or staples. ? Abdominal myomectomy-This method is used when the fibroid tumor cannot be removed with a hysteroscope or laparoscope. The surgery is performed through a larger surgical incision in the abdomen. The   fibroid tumor is removed through this incision. The incision is closed with sutures or staples.  What to expect after the procedure  If you had a laparoscopic or hysteroscopic myomectomy, you may be able to go home the same day, or you may need to stay in the hospital overnight.  If you had an abdominal myomectomy, you may need to stay in the hospital for a  few days.  Your IV access tube and catheter will be removed in 1-2 days.  You may be given medicine for pain or to help you sleep.  You may be given an antibiotic medicine, if needed. This information is not intended to replace advice given to you by your health care provider. Make sure you discuss any questions you have with your health care provider. Document Released: 06/10/2007 Document Revised: 01/19/2016 Document Reviewed: 03/25/2013 Elsevier Interactive Patient Education  2017 [employer]  

## 2017-07-29 ENCOUNTER — Other Ambulatory Visit: Payer: BLUE CROSS/BLUE SHIELD

## 2017-07-30 ENCOUNTER — Ambulatory Visit (INDEPENDENT_AMBULATORY_CARE_PROVIDER_SITE_OTHER): Payer: BLUE CROSS/BLUE SHIELD

## 2017-07-30 DIAGNOSIS — D259 Leiomyoma of uterus, unspecified: Secondary | ICD-10-CM | POA: Diagnosis not present

## 2020-02-23 ENCOUNTER — Ambulatory Visit (INDEPENDENT_AMBULATORY_CARE_PROVIDER_SITE_OTHER): Payer: BLUE CROSS/BLUE SHIELD | Admitting: Obstetrics and Gynecology

## 2020-02-23 ENCOUNTER — Other Ambulatory Visit (HOSPITAL_COMMUNITY)
Admission: RE | Admit: 2020-02-23 | Discharge: 2020-02-23 | Disposition: A | Discharge status: Home / Self Care | Payer: BLUE CROSS/BLUE SHIELD | Source: Ambulatory Visit | Attending: Obstetrics and Gynecology | Admitting: Obstetrics and Gynecology

## 2020-02-23 ENCOUNTER — Encounter: Payer: Self-pay | Admitting: Obstetrics and Gynecology

## 2020-02-23 ENCOUNTER — Other Ambulatory Visit: Payer: Self-pay

## 2020-02-23 VITALS — BP 130/72 | Ht 61.0 in | Wt 196.2 lb

## 2020-02-23 DIAGNOSIS — Z124 Encounter for screening for malignant neoplasm of cervix: Secondary | ICD-10-CM | POA: Diagnosis present

## 2020-02-23 DIAGNOSIS — N814 Uterovaginal prolapse, unspecified: Secondary | ICD-10-CM | POA: Diagnosis not present

## 2020-02-23 NOTE — Progress Notes (Signed)
Gynecology Annual Exam   PCP: Patient, No Pcp Per  Chief Complaint:  Chief Complaint  Patient presents with  . Gynecologic Exam    History of Present Illness: Patient is a 39 y.o. G2P1011 presents for annual exam. The patient has no complaints today.   LMP: Patient's last menstrual period was 02/19/2020. Average Interval: regular, 28 days Duration of flow: a few days Heavy Menses: no Clots: no Intermenstrual Bleeding: no Postcoital Bleeding: no Dysmenorrhea: no  She currently uses IUD for contraception. She denies dyspareunia.  The patient does perform self breast exams.  There is no notable family history of breast or ovarian cancer in her family. The patient denies current symptoms of depression.    Review of Systems: Review of Systems  Constitutional: Negative for chills, fever, malaise/fatigue and weight loss.  HENT: Negative for congestion, hearing loss and sinus pain.   Eyes: Negative for blurred vision and double vision.  Respiratory: Negative for cough, sputum production, shortness of breath and wheezing.   Cardiovascular: Negative for chest pain, palpitations, orthopnea and leg swelling.  Gastrointestinal: Negative for abdominal pain, constipation, diarrhea, nausea and vomiting.  Genitourinary: Negative for dysuria, flank pain, frequency, hematuria and urgency.  Musculoskeletal: Negative for back pain, falls and joint pain.  Skin: Negative for itching and rash.  Neurological: Negative for dizziness and headaches.  Psychiatric/Behavioral: Negative for depression, substance abuse and suicidal ideas. The patient is not nervous/anxious.     Past Medical History:  Patient Active Problem List   Diagnosis Date Noted  . Obesity (BMI 35.0-39.9 without comorbidity) 12/25/2015  . Pre-diabetes 12/25/2015    Past Surgical History:  Past Surgical History:  Procedure Laterality Date  . GALLBLADDER SURGERY    . HERNIA REPAIR      Gynecologic History:  Patient's  last menstrual period was 02/19/2020. Contraception: IUD  Obstetric History: G2P1011  Family History:  Family History  Problem Relation Age of Onset  . Hypertension Mother   . Breast cancer Paternal Aunt   . Ovarian cancer Neg Hx   . Colon cancer Neg Hx   . Osteoarthritis Neg Hx   . Diabetes Neg Hx   . Heart failure Neg Hx     Social History:  Social History   Socioeconomic History  . Marital status: Single    Spouse name: Not on file  . Number of children: Not on file  . Years of education: Not on file  . Highest education level: Not on file  Occupational History  . Not on file  Tobacco Use  . Smoking status: Former Smoker    Packs/day: 0.25    Quit date: 01/01/2017    Years since quitting: 3.3  . Smokeless tobacco: Never Used  Vaping Use  . Vaping Use: Never used  Substance and Sexual Activity  . Alcohol use: No  . Drug use: No  . Sexual activity: Yes    Birth control/protection: I.U.D.    Comment: Paragard   Other Topics Concern  . Not on file  Social History Narrative  . Not on file   Social Determinants of Health   Financial Resource Strain:   . Difficulty of Paying Living Expenses: Not on file  Food Insecurity:   . Worried About Charity fundraiser in the Last Year: Not on file  . Ran Out of Food in the Last Year: Not on file  Transportation Needs:   . Lack of Transportation (Medical): Not on file  . Lack of Transportation (Non-Medical):  Not on file  Physical Activity:   . Days of Exercise per Week: Not on file  . Minutes of Exercise per Session: Not on file  Stress:   . Feeling of Stress : Not on file  Social Connections:   . Frequency of Communication with Friends and Family: Not on file  . Frequency of Social Gatherings with Friends and Family: Not on file  . Attends Religious Services: Not on file  . Active Member of Clubs or Organizations: Not on file  . Attends Archivist Meetings: Not on file  . Marital Status: Not on file   Intimate Partner Violence:   . Fear of Current or Ex-Partner: Not on file  . Emotionally Abused: Not on file  . Physically Abused: Not on file  . Sexually Abused: Not on file    Allergies:  Allergies  Allergen Reactions  . Benzalkonium Chloride Rash  . Cephalexin Rash  . Neosporin  [Neomycin-Polymyxin-Gramicidin] Rash  . Sulfamethoxazole-Trimethoprim Nausea And Vomiting    Other reaction(s): Unknown    Medications: Prior to Admission medications   Medication Sig Start Date End Date Taking? Authorizing Provider  CLINDESSE vaginal cream Insert vaginally as a one time dose Patient not taking: Reported on 07/12/2017 01/10/17   Rubie Maid, MD  [facility] INTRAUTERINE COPPER IU by Intrauterine route.    [provider]    Physical Exam Vitals: Blood pressure 130/72, height 5\' 1"  (1.549 m), weight 196 lb 3.2 oz (89 kg), last menstrual period 02/19/2020.  General: NAD HEENT: normocephalic, anicteric Thyroid: no enlargement, no palpable nodules Pulmonary: No increased work of breathing, CTAB Cardiovascular: RRR, distal pulses 2+ Breast: Breast symmetrical, no tenderness, no palpable nodules or masses, no skin or nipple retraction present, no nipple discharge.  No axillary or supraclavicular lymphadenopathy. Abdomen: NABS, soft, non-tender, non-distended.  Umbilicus without lesions.  No hepatomegaly, splenomegaly or masses palpable. No evidence of hernia  Genitourinary:  External: Normal external female genitalia.  Normal urethral meatus, normal Bartholin's and Skene's glands.    Vagina: Normal vaginal mucosa, no evidence of prolapse.    Cervix: Grossly normal in appearance, no bleeding  Uterus: Non-enlarged, mobile, normal contour.  No CMT  Adnexa: ovaries non-enlarged, no adnexal masses  Rectal: deferred  Lymphatic: no evidence of inguinal lymphadenopathy Extremities: no edema, erythema, or tenderness Neurologic: Grossly intact Psychiatric: mood appropriate, affect  full  Female chaperone present for pelvic and breast  portions of the physical exam    Assessment: 39 y.o. G2P1011 routine annual exam  Plan: Problem List Items Addressed This Visit    None    Visit Diagnoses    Cervical cancer screening    -  Primary   Relevant Orders   Cytology - PAP (Completed)   Uterine prolapse       Relevant Orders   Ambulatory referral to Urogynecology      2) STI screening  wasoffered and accepted  2)  ASCCP guidelines and rational discussed.  Patient opts for every 5 years screening interval  3) Contraception - the patient is currently using  IUD.  She is happy with her current form of contraception and plans to continue  4) Routine healthcare maintenance including cholesterol, diabetes screening discussed managed by PCP  5) No follow-ups on file.  Adrian Prows MD, [facility], [facility] 05/22/2020 9:11 PM

## 2020-02-25 LAB — CYTOLOGY - PAP
Chlamydia: NEGATIVE
Comment: NEGATIVE
Comment: NEGATIVE
Comment: NEGATIVE
Comment: NORMAL
Diagnosis: NEGATIVE
High risk HPV: NEGATIVE
Neisseria Gonorrhea: NEGATIVE
Trichomonas: NEGATIVE

## 2020-05-22 ENCOUNTER — Encounter: Payer: Self-pay | Admitting: Obstetrics and Gynecology
